# Patient Record
Sex: Male | Born: 1965 | Race: Black or African American | Hispanic: No | Marital: Married | Smoking: Never smoker
Health system: Southern US, Community
[De-identification: ages and names within clinical notes are randomized; demographics above are authoritative.]

## PROBLEM LIST (undated history)

## (undated) DIAGNOSIS — G4733 Obstructive sleep apnea (adult) (pediatric): Secondary | ICD-10-CM

## (undated) DIAGNOSIS — Z9989 Dependence on other enabling machines and devices: Secondary | ICD-10-CM

## (undated) DIAGNOSIS — F419 Anxiety disorder, unspecified: Secondary | ICD-10-CM

## (undated) DIAGNOSIS — I10 Essential (primary) hypertension: Secondary | ICD-10-CM

## (undated) HISTORY — PX: KNEE ARTHROSCOPY: SUR90

## (undated) HISTORY — PX: UMBILICAL HERNIA REPAIR: SHX196

## (undated) HISTORY — PX: HERNIA REPAIR: SHX51

## (undated) HISTORY — PX: JOINT REPLACEMENT: SHX530

---

## 2009-03-24 ENCOUNTER — Ambulatory Visit: Payer: Self-pay | Admitting: Cardiology

## 2015-01-30 ENCOUNTER — Ambulatory Visit: Payer: Self-pay | Admitting: Orthopedic Surgery

## 2015-02-06 ENCOUNTER — Ambulatory Visit: Payer: Self-pay | Admitting: Orthopedic Surgery

## 2015-02-06 ENCOUNTER — Encounter (HOSPITAL_COMMUNITY): Payer: Self-pay

## 2015-02-06 ENCOUNTER — Encounter (HOSPITAL_COMMUNITY)
Admission: RE | Admit: 2015-02-06 | Discharge: 2015-02-06 | Disposition: A | Discharge status: Home / Self Care | Payer: No Typology Code available for payment source | Source: Ambulatory Visit | Attending: Orthopedic Surgery | Admitting: Orthopedic Surgery

## 2015-02-06 DIAGNOSIS — Z01818 Encounter for other preprocedural examination: Secondary | ICD-10-CM | POA: Diagnosis not present

## 2015-02-06 DIAGNOSIS — M1612 Unilateral primary osteoarthritis, left hip: Secondary | ICD-10-CM | POA: Diagnosis not present

## 2015-02-06 DIAGNOSIS — I10 Essential (primary) hypertension: Secondary | ICD-10-CM | POA: Insufficient documentation

## 2015-02-06 DIAGNOSIS — Z0183 Encounter for blood typing: Secondary | ICD-10-CM | POA: Insufficient documentation

## 2015-02-06 DIAGNOSIS — Z01812 Encounter for preprocedural laboratory examination: Secondary | ICD-10-CM | POA: Diagnosis not present

## 2015-02-06 HISTORY — DX: Anxiety disorder, unspecified: F41.9

## 2015-02-06 HISTORY — DX: Essential (primary) hypertension: I10

## 2015-02-06 LAB — URINALYSIS, ROUTINE W REFLEX MICROSCOPIC
Bilirubin Urine: NEGATIVE
GLUCOSE, UA: NEGATIVE mg/dL
Ketones, ur: NEGATIVE mg/dL
Nitrite: NEGATIVE
PH: 6 (ref 5.0–8.0)
PROTEIN: NEGATIVE mg/dL
Specific Gravity, Urine: 1.017 (ref 1.005–1.030)
Urobilinogen, UA: 1 mg/dL (ref 0.0–1.0)

## 2015-02-06 LAB — CBC
HCT: 46.1 % (ref 39.0–52.0)
Hemoglobin: 15.3 g/dL (ref 13.0–17.0)
MCH: 29.1 pg (ref 26.0–34.0)
MCHC: 33.2 g/dL (ref 30.0–36.0)
MCV: 87.6 fL (ref 78.0–100.0)
Platelets: 344 10*3/uL (ref 150–400)
RBC: 5.26 MIL/uL (ref 4.22–5.81)
RDW: 14 % (ref 11.5–15.5)
WBC: 8.2 10*3/uL (ref 4.0–10.5)

## 2015-02-06 LAB — COMPREHENSIVE METABOLIC PANEL
ALBUMIN: 3.5 g/dL (ref 3.5–5.0)
ALT: 11 U/L — ABNORMAL LOW (ref 17–63)
ANION GAP: 7 (ref 5–15)
AST: 18 U/L (ref 15–41)
Alkaline Phosphatase: 61 U/L (ref 38–126)
BUN: 12 mg/dL (ref 6–20)
CO2: 29 mmol/L (ref 22–32)
Calcium: 9.4 mg/dL (ref 8.9–10.3)
Chloride: 99 mmol/L — ABNORMAL LOW (ref 101–111)
Creatinine, Ser: 1.17 mg/dL (ref 0.61–1.24)
GFR calc non Af Amer: >60 mL/min (ref >60)
GLUCOSE: 116 mg/dL — AB (ref 65–99)
POTASSIUM: 3.2 mmol/L — AB (ref 3.5–5.1)
SODIUM: 135 mmol/L (ref 135–145)
TOTAL PROTEIN: 7.2 g/dL (ref 6.5–8.1)
Total Bilirubin: 0.9 mg/dL (ref 0.3–1.2)

## 2015-02-06 LAB — PROTIME-INR
INR: 1.12 (ref 0.00–1.49)
Prothrombin Time: 14.6 seconds (ref 11.6–15.2)

## 2015-02-06 LAB — URINE MICROSCOPIC-ADD ON

## 2015-02-06 LAB — TYPE AND SCREEN
ABO/RH(D): B POS
ANTIBODY SCREEN: NEGATIVE

## 2015-02-06 LAB — ABO/RH: ABO/RH(D): B POS

## 2015-02-06 LAB — SURGICAL PCR SCREEN
MRSA, PCR: NEGATIVE
Staphylococcus aureus: NEGATIVE

## 2015-02-06 LAB — APTT: APTT: 30 s (ref 24–37)

## 2015-02-06 NOTE — H&P (Signed)
TOTAL HIP ADMISSION H&P  Patient is admitted for left total hip arthroplasty.  Subjective:  Chief Complaint: left hip pain  HPI: Jesus Tapia, 49 y.o. male, has a history of pain and functional disability in the left hip(s) due to arthritis and patient has failed non-surgical conservative treatments for greater than 12 weeks to include NSAID's and/or analgesics, flexibility and strengthening excercises, use of assistive devices, weight reduction as appropriate and activity modification.  Onset of symptoms was gradual starting 1 years ago with gradually worsening course since that time.The patient noted no past surgery on the left hip(s).  Patient currently rates pain in the left hip at 10 out of 10 with activity. Patient has night pain, worsening of pain with activity and weight bearing, pain that interfers with activities of daily living and pain with passive range of motion. Patient has evidence of subchondral cysts, subchondral sclerosis, periarticular osteophytes, joint subluxation and joint space narrowing by imaging studies. This condition presents safety issues increasing the risk of falls. There is no current active infection.  There are no active problems to display for this patient.  Past Medical History  Diagnosis Date  . Hypertension   . Sleep apnea     CPAP  USES MAYBE 1 X WEEK   . Anxiety     Past Surgical History  Procedure Laterality Date  . Knee arthroscopy      RT KNEE   . Hernia repair      UMBILICAL      (Not in a hospital admission) No Known Allergies  Social History  Substance Use Topics  . Smoking status: Never Smoker   . Smokeless tobacco: Not on file  . Alcohol Use: No    No family history on file.   Review of Systems  Constitutional: Negative.   HENT: Negative.   Eyes: Negative.   Respiratory: Negative.   Cardiovascular: Negative.   Gastrointestinal: Negative.   Genitourinary: Negative.   Musculoskeletal: Positive for joint pain.  Skin:  Negative.   Neurological: Negative.   Endo/Heme/Allergies: Negative.     Objective:  Physical Exam  Vitals reviewed. Constitutional: He is oriented to person, place, and time. He appears well-developed and well-nourished.  HENT:  Head: Normocephalic and atraumatic.  Eyes: Conjunctivae and EOM are normal. Pupils are equal, round, and reactive to light.  Neck: Normal range of motion. Neck supple.  Cardiovascular: Normal rate and regular rhythm.   Respiratory: Effort normal and breath sounds normal.  GI: Soft. Bowel sounds are normal. He exhibits no distension. There is no tenderness.  Genitourinary:  deferred  Musculoskeletal:       Left hip: He exhibits decreased range of motion and decreased strength.  2+ DP. + TA/GS/EHL. SILT.  Neurological: He is alert and oriented to person, place, and time. He has normal reflexes.  Skin: Skin is warm and dry.  Psychiatric: He has a normal mood and affect. His behavior is normal. Judgment and thought content normal.    Vital signs in last 24 hours: @VSRANGES @  Labs:   Estimated body mass index is 40.29 kg/(m^2) as calculated from the following:   Height as of an earlier encounter on 02/06/15: 6\' 1"  (1.854 m).   Weight as of an earlier encounter on 02/06/15: 138.483 kg (305 lb 4.8 oz).   Imaging Review Plain radiographs demonstrate severe degenerative joint disease of the left hip(s). The bone quality appears to be adequate for age and reported activity level.  Assessment/Plan:  End stage arthritis, left hip(s)  The patient history, physical examination, clinical judgement of the provider and imaging studies are consistent with end stage degenerative joint disease of the left hip(s) and total hip arthroplasty is deemed medically necessary. The treatment options including medical management, injection therapy, arthroscopy and arthroplasty were discussed at length. The risks and benefits of total hip arthroplasty were presented and  reviewed. The risks due to aseptic loosening, infection, stiffness, dislocation/subluxation,  thromboembolic complications and other imponderables were discussed.  The patient acknowledged the explanation, agreed to proceed with the plan and consent was signed. Patient is being admitted for inpatient treatment for surgery, pain control, PT, OT, prophylactic antibiotics, VTE prophylaxis, progressive ambulation and ADL's and discharge planning.The patient is planning to be discharged home with home health services

## 2015-02-06 NOTE — Pre-Procedure Instructions (Signed)
Jesus Tapia  02/06/2015      [facility] - [place], [place] - [place] [address] Phone: [phone] Fax: [phone]    Your procedure is scheduled on  Thursday  02/14/15  Report to [facility] Admitting at 700 A.M.  Call this number if you have problems the morning of surgery:  [phone]   Remember:  Do not eat food or drink liquids after midnight.  Take these medicines the morning of surgery with A SIP OF WATER    AMLODIPINE (NORVASC), PAROXETINE (PAXIL)  (STOP IBUPROFEN/ADVIL/MOTRIN)    Do not wear jewelry, make-up or nail polish.  Do not wear lotions, powders, or perfumes.  You may wear deodorant.  Do not shave 48 hours prior to surgery.  Men may shave face and neck.  Do not bring valuables to the hospital.  [facility] is not responsible for any belongings or valuables.  Contacts, dentures or bridgework may not be worn into surgery.  Leave your suitcase in the car.  After surgery it may be brought to your room.  For patients admitted to the hospital, discharge time will be determined by your treatment team.  Patients discharged the day of surgery will not be allowed to drive home.   Name and phone number of your driver:   Special instructions:  [place] - Preparing for Surgery  Before surgery, you can play an important role.  Because skin is not sterile, your skin needs to be as free of germs as possible.  You can reduce the number of germs on you skin by washing with CHG (chlorahexidine gluconate) soap before surgery.  CHG is an antiseptic cleaner which kills germs and bonds with the skin to continue killing germs even after washing.  Please DO NOT use if you have an allergy to CHG or antibacterial soaps.  If your skin becomes reddened/irritated stop using the CHG and inform your nurse when you arrive at Short Stay.  Do not shave (including legs and underarms) for at least 48 hours prior to the first CHG shower.  You may  shave your face.  Please follow these instructions carefully:   1.  Shower with CHG Soap the night before surgery and the                                morning of Surgery.  2.  If you choose to wash your hair, wash your hair first as usual with your       normal shampoo.  3.  After you shampoo, rinse your hair and body thoroughly to remove the                      Shampoo.  4.  Use CHG as you would any other liquid soap.  You can apply chg directly       to the skin and wash gently with scrungie or a clean washcloth.  5.  Apply the CHG Soap to your body ONLY FROM THE NECK DOWN.        Do not use on open wounds or open sores.  Avoid contact with your eyes,       ears, mouth and genitals (private parts).  Wash genitals (private parts)       with your normal soap.  6.  Wash thoroughly, paying special attention to the area where  your surgery        will be performed.  7.  Thoroughly rinse your body with warm water from the neck down.  8.  DO NOT shower/wash with your normal soap after using and rinsing off       the CHG Soap.  9.  Pat yourself dry with a clean towel.            10.  Wear clean pajamas.            11.  Place clean sheets on your bed the night of your first shower and do not        sleep with pets.  Day of Surgery  Do not apply any lotions/deoderants the morning of surgery.  Please wear clean clothes to the hospital/surgery center.    Please read over the following fact sheets that you were given. Pain Booklet, Coughing and Deep Breathing, Blood Transfusion Information, Total Joint Packet, MRSA Information and Surgical Site Infection Prevention

## 2015-02-13 MED ORDER — CHLORHEXIDINE GLUCONATE 4 % EX LIQD: 60.0000 mL | Freq: Once | CUTANEOUS | Status: DC

## 2015-02-13 MED ORDER — DEXTROSE 5 % IV SOLN
3.0000 g | INTRAVENOUS | Status: AC
  Administered 2015-02-14: 3 g via INTRAVENOUS
  Filled 2015-02-13: qty 3000

## 2015-02-13 MED ORDER — SODIUM CHLORIDE 0.9 % IV SOLN
1000.0000 mg | INTRAVENOUS | Status: DC
  Filled 2015-02-13: qty 10

## 2015-02-13 MED ORDER — TRANEXAMIC ACID 1000 MG/10ML IV SOLN
1000.0000 mg | INTRAVENOUS | Status: AC
  Administered 2015-02-14: 1000 mg via INTRAVENOUS
  Filled 2015-02-13: qty 10

## 2015-02-13 MED ORDER — TRANEXAMIC ACID 1000 MG/10ML IV SOLN
1000.0000 mg | INTRAVENOUS | Status: DC
  Filled 2015-02-13: qty 10

## 2015-02-13 MED ORDER — SODIUM CHLORIDE 0.9 % IV SOLN: INTRAVENOUS | Status: DC

## 2015-02-14 ENCOUNTER — Encounter (HOSPITAL_COMMUNITY): Payer: Self-pay | Admitting: *Deleted

## 2015-02-14 ENCOUNTER — Encounter (HOSPITAL_COMMUNITY)
Admission: RE | Disposition: A | Discharge status: Home / Self Care | Payer: Self-pay | Source: Ambulatory Visit | Attending: Orthopedic Surgery

## 2015-02-14 ENCOUNTER — Inpatient Hospital Stay (HOSPITAL_COMMUNITY)
Admission: RE | Admit: 2015-02-14 | Discharge: 2015-02-15 | DRG: 470 | Disposition: A | Discharge status: Home / Self Care | Payer: PRIVATE HEALTH INSURANCE | Source: Ambulatory Visit | Attending: Orthopedic Surgery | Admitting: Orthopedic Surgery

## 2015-02-14 ENCOUNTER — Inpatient Hospital Stay (HOSPITAL_COMMUNITY): Payer: PRIVATE HEALTH INSURANCE

## 2015-02-14 ENCOUNTER — Inpatient Hospital Stay (HOSPITAL_COMMUNITY): Payer: PRIVATE HEALTH INSURANCE | Admitting: Certified Registered Nurse Anesthetist

## 2015-02-14 DIAGNOSIS — Z09 Encounter for follow-up examination after completed treatment for conditions other than malignant neoplasm: Secondary | ICD-10-CM

## 2015-02-14 DIAGNOSIS — M1612 Unilateral primary osteoarthritis, left hip: Secondary | ICD-10-CM | POA: Diagnosis present

## 2015-02-14 DIAGNOSIS — Z79899 Other long term (current) drug therapy: Secondary | ICD-10-CM

## 2015-02-14 DIAGNOSIS — G4733 Obstructive sleep apnea (adult) (pediatric): Secondary | ICD-10-CM | POA: Diagnosis present

## 2015-02-14 DIAGNOSIS — Z419 Encounter for procedure for purposes other than remedying health state, unspecified: Secondary | ICD-10-CM

## 2015-02-14 DIAGNOSIS — I10 Essential (primary) hypertension: Secondary | ICD-10-CM | POA: Diagnosis present

## 2015-02-14 DIAGNOSIS — Z6841 Body Mass Index (BMI) 40.0 and over, adult: Secondary | ICD-10-CM

## 2015-02-14 DIAGNOSIS — F419 Anxiety disorder, unspecified: Secondary | ICD-10-CM | POA: Diagnosis present

## 2015-02-14 HISTORY — PX: TOTAL HIP ARTHROPLASTY: SHX124

## 2015-02-14 HISTORY — DX: Obstructive sleep apnea (adult) (pediatric): G47.33

## 2015-02-14 HISTORY — DX: Dependence on other enabling machines and devices: Z99.89

## 2015-02-14 SURGERY — ARTHROPLASTY, HIP, TOTAL, ANTERIOR APPROACH
Anesthesia: General | Site: Hip | Laterality: Left

## 2015-02-14 MED ORDER — METHOCARBAMOL 500 MG PO TABS
500.0000 mg | ORAL_TABLET | Freq: Four times a day (QID) | ORAL | Status: DC | PRN
  Administered 2015-02-14 – 2015-02-15 (×2): 500 mg via ORAL
  Filled 2015-02-14 (×2): qty 1

## 2015-02-14 MED ORDER — KETOROLAC TROMETHAMINE 30 MG/ML IJ SOLN
INTRAMUSCULAR | Status: DC | PRN
  Administered 2015-02-14: 30 mg via INTRA_ARTICULAR

## 2015-02-14 MED ORDER — PROMETHAZINE HCL 25 MG/ML IJ SOLN
6.2500 mg | INTRAMUSCULAR | Status: DC | PRN
Start: 2015-02-14 — End: 2015-02-14

## 2015-02-14 MED ORDER — ROCURONIUM BROMIDE 100 MG/10ML IV SOLN
INTRAVENOUS | Status: DC | PRN
  Administered 2015-02-14 (×2): 20 mg via INTRAVENOUS
  Administered 2015-02-14: 10 mg via INTRAVENOUS
  Administered 2015-02-14: 50 mg via INTRAVENOUS

## 2015-02-14 MED ORDER — BUPIVACAINE-EPINEPHRINE (PF) 0.5% -1:200000 IJ SOLN
INTRAMUSCULAR | Status: AC
  Filled 2015-02-14: qty 30

## 2015-02-14 MED ORDER — GLYCOPYRROLATE 0.2 MG/ML IJ SOLN
INTRAMUSCULAR | Status: DC | PRN
  Administered 2015-02-14: .8 mg via INTRAVENOUS

## 2015-02-14 MED ORDER — ONDANSETRON HCL 4 MG PO TABS: 4.0000 mg | ORAL_TABLET | Freq: Four times a day (QID) | ORAL | Status: DC | PRN

## 2015-02-14 MED ORDER — MIDAZOLAM HCL 5 MG/5ML IJ SOLN
INTRAMUSCULAR | Status: DC | PRN
  Administered 2015-02-14: 2 mg via INTRAVENOUS

## 2015-02-14 MED ORDER — GLYCOPYRROLATE 0.2 MG/ML IJ SOLN
INTRAMUSCULAR | Status: AC
  Filled 2015-02-14: qty 4

## 2015-02-14 MED ORDER — DEXAMETHASONE SODIUM PHOSPHATE 10 MG/ML IJ SOLN
10.0000 mg | Freq: Once | INTRAMUSCULAR | Status: AC
  Administered 2015-02-15: 10 mg via INTRAVENOUS
  Filled 2015-02-14: qty 1

## 2015-02-14 MED ORDER — DOCUSATE SODIUM 100 MG PO CAPS
100.0000 mg | ORAL_CAPSULE | Freq: Two times a day (BID) | ORAL | Status: DC
  Administered 2015-02-14 – 2015-02-15 (×3): 100 mg via ORAL
  Filled 2015-02-14 (×3): qty 1

## 2015-02-14 MED ORDER — SUCCINYLCHOLINE CHLORIDE 20 MG/ML IJ SOLN
INTRAMUSCULAR | Status: AC
Start: 2015-02-14 — End: 2015-02-14
  Filled 2015-02-14: qty 1

## 2015-02-14 MED ORDER — METOCLOPRAMIDE HCL 5 MG PO TABS: 5.0000 mg | ORAL_TABLET | Freq: Three times a day (TID) | ORAL | Status: DC | PRN

## 2015-02-14 MED ORDER — BUPIVACAINE-EPINEPHRINE (PF) 0.5% -1:200000 IJ SOLN
INTRAMUSCULAR | Status: DC | PRN
  Administered 2015-02-14: 30 mL via PERINEURAL

## 2015-02-14 MED ORDER — 0.9 % SODIUM CHLORIDE (POUR BTL) OPTIME
TOPICAL | Status: DC | PRN
  Administered 2015-02-14: 1000 mL

## 2015-02-14 MED ORDER — NEOSTIGMINE METHYLSULFATE 10 MG/10ML IV SOLN
INTRAVENOUS | Status: DC | PRN
  Administered 2015-02-14: 5 mg via INTRAVENOUS

## 2015-02-14 MED ORDER — ACETAMINOPHEN 650 MG RE SUPP: 650.0000 mg | Freq: Four times a day (QID) | RECTAL | Status: DC | PRN

## 2015-02-14 MED ORDER — MEPERIDINE HCL 25 MG/ML IJ SOLN: 6.2500 mg | INTRAMUSCULAR | Status: DC | PRN

## 2015-02-14 MED ORDER — HYDROMORPHONE HCL 1 MG/ML IJ SOLN: 0.5000 mg | INTRAMUSCULAR | Status: DC | PRN

## 2015-02-14 MED ORDER — FENTANYL CITRATE (PF) 250 MCG/5ML IJ SOLN
INTRAMUSCULAR | Status: AC
  Filled 2015-02-14: qty 5

## 2015-02-14 MED ORDER — MIDAZOLAM HCL 2 MG/2ML IJ SOLN
INTRAMUSCULAR | Status: AC
  Filled 2015-02-14: qty 4

## 2015-02-14 MED ORDER — METHOCARBAMOL 1000 MG/10ML IJ SOLN
500.0000 mg | Freq: Four times a day (QID) | INTRAVENOUS | Status: DC | PRN
  Filled 2015-02-14: qty 5

## 2015-02-14 MED ORDER — LIDOCAINE HCL (CARDIAC) 20 MG/ML IV SOLN
INTRAVENOUS | Status: DC | PRN
  Administered 2015-02-14: 30 mg via INTRAVENOUS

## 2015-02-14 MED ORDER — ALBUMIN HUMAN 5 % IV SOLN
12.5000 g | Freq: Once | INTRAVENOUS | Status: AC
  Administered 2015-02-14: 12.5 g via INTRAVENOUS
  Filled 2015-02-14: qty 250

## 2015-02-14 MED ORDER — SODIUM CHLORIDE 0.9 % IR SOLN
Status: DC | PRN
  Administered 2015-02-14: 1000 mL
  Administered 2015-02-14: 3000 mL

## 2015-02-14 MED ORDER — MENTHOL 3 MG MT LOZG: 1.0000 | LOZENGE | OROMUCOSAL | Status: DC | PRN

## 2015-02-14 MED ORDER — LIDOCAINE HCL (CARDIAC) 20 MG/ML IV SOLN
INTRAVENOUS | Status: AC
Start: 2015-02-14 — End: 2015-02-14
  Filled 2015-02-14: qty 5

## 2015-02-14 MED ORDER — PAROXETINE HCL 20 MG PO TABS
20.0000 mg | ORAL_TABLET | Freq: Every day | ORAL | Status: DC
  Administered 2015-02-14: 20 mg via ORAL
  Filled 2015-02-14 (×2): qty 1

## 2015-02-14 MED ORDER — ALBUTEROL SULFATE HFA 108 (90 BASE) MCG/ACT IN AERS
INHALATION_SPRAY | RESPIRATORY_TRACT | Status: DC | PRN
  Administered 2015-02-14: 6 via RESPIRATORY_TRACT

## 2015-02-14 MED ORDER — ACETAMINOPHEN 325 MG PO TABS: 650.0000 mg | ORAL_TABLET | Freq: Four times a day (QID) | ORAL | Status: DC | PRN

## 2015-02-14 MED ORDER — ALBUMIN HUMAN 5 % IV SOLN
INTRAVENOUS | Status: AC
  Filled 2015-02-14: qty 250

## 2015-02-14 MED ORDER — METOCLOPRAMIDE HCL 5 MG/ML IJ SOLN: 5.0000 mg | Freq: Three times a day (TID) | INTRAMUSCULAR | Status: DC | PRN

## 2015-02-14 MED ORDER — HYDROCODONE-ACETAMINOPHEN 5-325 MG PO TABS
1.0000 | ORAL_TABLET | ORAL | Status: DC | PRN
  Administered 2015-02-14: 2 via ORAL
  Administered 2015-02-14: 1 via ORAL
  Administered 2015-02-15: 2 via ORAL
  Filled 2015-02-14 (×2): qty 2
  Filled 2015-02-14: qty 1

## 2015-02-14 MED ORDER — HYDROMORPHONE HCL 1 MG/ML IJ SOLN: 0.2500 mg | INTRAMUSCULAR | Status: DC | PRN

## 2015-02-14 MED ORDER — AMLODIPINE BESYLATE 10 MG PO TABS
10.0000 mg | ORAL_TABLET | Freq: Every day | ORAL | Status: DC
  Administered 2015-02-15: 10 mg via ORAL
  Filled 2015-02-14: qty 1

## 2015-02-14 MED ORDER — SENNA 8.6 MG PO TABS
2.0000 | ORAL_TABLET | Freq: Every day | ORAL | Status: DC
  Administered 2015-02-14: 17.2 mg via ORAL
  Filled 2015-02-14: qty 2

## 2015-02-14 MED ORDER — PROPOFOL 10 MG/ML IV BOLUS
INTRAVENOUS | Status: DC | PRN
  Administered 2015-02-14: 200 mg via INTRAVENOUS

## 2015-02-14 MED ORDER — ONDANSETRON HCL 4 MG/2ML IJ SOLN
INTRAMUSCULAR | Status: DC | PRN
  Administered 2015-02-14: 4 mg via INTRAVENOUS

## 2015-02-14 MED ORDER — ROCURONIUM BROMIDE 50 MG/5ML IV SOLN
INTRAVENOUS | Status: AC
  Filled 2015-02-14: qty 1

## 2015-02-14 MED ORDER — DEXAMETHASONE SODIUM PHOSPHATE 4 MG/ML IJ SOLN
INTRAMUSCULAR | Status: DC | PRN
  Administered 2015-02-14: 4 mg via INTRAVENOUS

## 2015-02-14 MED ORDER — FENTANYL CITRATE (PF) 100 MCG/2ML IJ SOLN
INTRAMUSCULAR | Status: DC | PRN
  Administered 2015-02-14 (×2): 100 ug via INTRAVENOUS
  Administered 2015-02-14: 250 ug via INTRAVENOUS
  Administered 2015-02-14: 50 ug via INTRAVENOUS

## 2015-02-14 MED ORDER — ASPIRIN EC 325 MG PO TBEC
325.0000 mg | DELAYED_RELEASE_TABLET | Freq: Every day | ORAL | Status: DC
  Administered 2015-02-15: 325 mg via ORAL
  Filled 2015-02-14: qty 1

## 2015-02-14 MED ORDER — MIDAZOLAM HCL 2 MG/2ML IJ SOLN: 0.5000 mg | Freq: Once | INTRAMUSCULAR | Status: DC | PRN

## 2015-02-14 MED ORDER — PHENOL 1.4 % MT LIQD: 1.0000 | OROMUCOSAL | Status: DC | PRN

## 2015-02-14 MED ORDER — ONDANSETRON HCL 4 MG/2ML IJ SOLN: 4.0000 mg | Freq: Four times a day (QID) | INTRAMUSCULAR | Status: DC | PRN

## 2015-02-14 MED ORDER — ACETAMINOPHEN 10 MG/ML IV SOLN
1000.0000 mg | INTRAVENOUS | Status: AC
  Administered 2015-02-14: 1000 mg via INTRAVENOUS
  Filled 2015-02-14: qty 100

## 2015-02-14 MED ORDER — ONDANSETRON HCL 4 MG/2ML IJ SOLN
INTRAMUSCULAR | Status: AC
  Filled 2015-02-14: qty 2

## 2015-02-14 MED ORDER — EPHEDRINE SULFATE 50 MG/ML IJ SOLN
INTRAMUSCULAR | Status: DC | PRN
  Administered 2015-02-14: 15 mg via INTRAVENOUS
  Administered 2015-02-14: 5 mg via INTRAVENOUS
  Administered 2015-02-14: 10 mg via INTRAVENOUS

## 2015-02-14 MED ORDER — PROPOFOL 10 MG/ML IV BOLUS
INTRAVENOUS | Status: AC
Start: 2015-02-14 — End: 2015-02-14
  Filled 2015-02-14: qty 20

## 2015-02-14 MED ORDER — SODIUM CHLORIDE 0.9 % IV SOLN
INTRAVENOUS | Status: DC
  Administered 2015-02-14 – 2015-02-15 (×2): via INTRAVENOUS

## 2015-02-14 MED ORDER — PROPOFOL 10 MG/ML IV BOLUS
INTRAVENOUS | Status: AC
  Filled 2015-02-14: qty 20

## 2015-02-14 MED ORDER — DEXAMETHASONE SODIUM PHOSPHATE 4 MG/ML IJ SOLN
INTRAMUSCULAR | Status: AC
  Filled 2015-02-14: qty 1

## 2015-02-14 MED ORDER — LACTATED RINGERS IV SOLN
INTRAVENOUS | Status: DC
  Administered 2015-02-14 (×2): via INTRAVENOUS

## 2015-02-14 MED ORDER — CEFAZOLIN SODIUM-DEXTROSE 2-3 GM-% IV SOLR
2.0000 g | Freq: Four times a day (QID) | INTRAVENOUS | Status: AC
  Administered 2015-02-14 (×2): 2 g via INTRAVENOUS
  Filled 2015-02-14 (×3): qty 50

## 2015-02-14 SURGICAL SUPPLY — 53 items
BLADE SAW SGTL 18X1.27X75 (BLADE) IMPLANT
BLADE SAW SGTL 18X1.27X75MM (BLADE)
BLADE SURG ROTATE 9660 (MISCELLANEOUS) ×3 IMPLANT
CAPT HIP TOTAL 2 ×3 IMPLANT
CHLORAPREP W/TINT 26ML (MISCELLANEOUS) ×3 IMPLANT
COVER SURGICAL LIGHT HANDLE (MISCELLANEOUS) ×3 IMPLANT
DERMABOND ADVANCED (GAUZE/BANDAGES/DRESSINGS) ×2
DERMABOND ADVANCED .7 DNX12 (GAUZE/BANDAGES/DRESSINGS) ×1 IMPLANT
DRAPE C-ARM 42X72 X-RAY (DRAPES) ×3 IMPLANT
DRAPE IMP U-DRAPE 54X76 (DRAPES) ×6 IMPLANT
DRAPE STERI IOBAN 125X83 (DRAPES) ×3 IMPLANT
DRAPE U-SHAPE 47X51 STRL (DRAPES) ×9 IMPLANT
DRSG AQUACEL AG ADV 3.5X10 (GAUZE/BANDAGES/DRESSINGS) ×3 IMPLANT
ELECT BLADE 4.0 EZ CLEAN MEGAD (MISCELLANEOUS) ×3
ELECT REM PT RETURN 9FT ADLT (ELECTROSURGICAL) ×3
ELECTRODE BLDE 4.0 EZ CLN MEGD (MISCELLANEOUS) ×1 IMPLANT
ELECTRODE REM PT RTRN 9FT ADLT (ELECTROSURGICAL) ×1 IMPLANT
EVACUATOR 1/8 PVC DRAIN (DRAIN) IMPLANT
GLOVE BIO SURGEON STRL SZ8.5 (GLOVE) ×12 IMPLANT
GLOVE BIOGEL PI IND STRL 8.5 (GLOVE) ×1 IMPLANT
GLOVE BIOGEL PI INDICATOR 8.5 (GLOVE) ×2
GOWN STRL REUS W/ TWL LRG LVL3 (GOWN DISPOSABLE) ×3 IMPLANT
GOWN STRL REUS W/TWL 2XL LVL3 (GOWN DISPOSABLE) ×3 IMPLANT
GOWN STRL REUS W/TWL LRG LVL3 (GOWN DISPOSABLE) ×6
HANDPIECE INTERPULSE COAX TIP (DISPOSABLE) ×2
HOOD PEEL AWAY FACE SHEILD DIS (HOOD) ×3 IMPLANT
KIT BASIN OR (CUSTOM PROCEDURE TRAY) ×3 IMPLANT
KIT ROOM TURNOVER OR (KITS) ×3 IMPLANT
MANIFOLD NEPTUNE II (INSTRUMENTS) ×3 IMPLANT
MARKER SKIN DUAL TIP RULER LAB (MISCELLANEOUS) ×3 IMPLANT
NEEDLE 18GX1X1/2 (RX/OR ONLY) (NEEDLE) ×3 IMPLANT
NEEDLE SPNL 18GX3.5 QUINCKE PK (NEEDLE) ×3 IMPLANT
NS IRRIG 1000ML POUR BTL (IV SOLUTION) ×3 IMPLANT
PACK TOTAL JOINT (CUSTOM PROCEDURE TRAY) ×3 IMPLANT
PACK UNIVERSAL I (CUSTOM PROCEDURE TRAY) ×3 IMPLANT
PAD ARMBOARD 7.5X6 YLW CONV (MISCELLANEOUS) ×6 IMPLANT
SAW OSC TIP CART 19.5X105X1.3 (SAW) ×3 IMPLANT
SEALER BIPOLAR AQUA 6.0 (INSTRUMENTS) ×3 IMPLANT
SET HNDPC FAN SPRY TIP SCT (DISPOSABLE) ×1 IMPLANT
SUCTION FRAZIER TIP 10 FR DISP (SUCTIONS) ×3 IMPLANT
SUT ETHIBOND NAB CT1 #1 30IN (SUTURE) ×6 IMPLANT
SUT MNCRL AB 3-0 PS2 18 (SUTURE) ×3 IMPLANT
SUT MON AB 2-0 CT1 36 (SUTURE) ×3 IMPLANT
SUT VIC AB 1 CT1 27 (SUTURE) ×2
SUT VIC AB 1 CT1 27XBRD ANBCTR (SUTURE) ×1 IMPLANT
SUT VIC AB 2-0 CT1 27 (SUTURE) ×2
SUT VIC AB 2-0 CT1 TAPERPNT 27 (SUTURE) ×1 IMPLANT
SUT VLOC 180 0 24IN GS25 (SUTURE) ×3 IMPLANT
SYR 30ML SLIP (SYRINGE) ×3 IMPLANT
SYR 50ML LL SCALE MARK (SYRINGE) ×3 IMPLANT
SYR 5ML LL (SYRINGE) ×3 IMPLANT
TOWEL OR 17X24 6PK STRL BLUE (TOWEL DISPOSABLE) ×3 IMPLANT
TOWEL OR 17X26 10 PK STRL BLUE (TOWEL DISPOSABLE) ×3 IMPLANT

## 2015-02-14 NOTE — Interval H&P Note (Signed)
History and Physical Interval Note:  02/14/2015 8:51 AM  Jesus Tapia  has presented today for surgery, with the diagnosis of LEFT HIP OA  The various methods of treatment have been discussed with the patient and family. After consideration of risks, benefits and other options for treatment, the patient has consented to  Procedure(s): TOTAL HIP ARTHROPLASTY ANTERIOR APPROACH (Left) as a surgical intervention .  The patient's history has been reviewed, patient examined, no change in status, stable for surgery.  I have reviewed the patient's chart and labs.  Questions were answered to the patient's satisfaction.     Ashaki Frosch, Horald Pollen

## 2015-02-14 NOTE — Discharge Summary (Signed)
Physician Discharge Summary  Patient ID: Jesus Tapia MRN: [id number] DOB/AGE: [birth year] 49 y.o.  Admit date: 02/14/2015 Discharge date: 02/15/2015  Admission Diagnoses:  Osteoarthritis of left hip  Discharge Diagnoses:  Principal Problem:   Osteoarthritis of left hip   Past Medical History  Diagnosis Date  . Hypertension   . Anxiety   . OSA on CPAP     "might use CPAP once/month" (02/14/2015)    Surgeries: Procedure(s): TOTAL HIP ARTHROPLASTY ANTERIOR APPROACH on 02/14/2015   Consultants (if any):    Discharged Condition: Improved  Hospital Course: Jesus Tapia is an 49 y.o. male who was admitted 02/14/2015 with a diagnosis of Osteoarthritis of left hip and went to the operating room on 02/14/2015 and underwent the above named procedures.    He was given perioperative antibiotics:      Anti-infectives    Start     Dose/Rate Route Frequency Ordered Stop   02/14/15 1530  ceFAZolin (ANCEF) IVPB 2 g/50 mL premix     2 g 100 mL/hr over 30 Minutes Intravenous Every 6 hours 02/14/15 1412 02/14/15 2147   02/14/15 0900  ceFAZolin (ANCEF) 3 g in dextrose 5 % 50 mL IVPB     3 g 160 mL/hr over 30 Minutes Intravenous To [facility] 02/13/15 1316 02/14/15 0944    .  He was given sequential compression devices, early ambulation, and ASA for DVT prophylaxis.  He benefited maximally from the hospital stay and there were no complications.    Recent vital signs:  Filed Vitals:   02/15/15 0412  BP: 109/67  Pulse: 79  Temp: 98.7 F (37.1 C)  Resp: 16    Recent laboratory studies:  Lab Results  Component Value Date   HGB 11.3* 02/15/2015   HGB 15.3 02/06/2015   Lab Results  Component Value Date   WBC 12.3* 02/15/2015   PLT 300 02/15/2015   Lab Results  Component Value Date   INR 1.12 02/06/2015   Lab Results  Component Value Date   NA 138 02/15/2015   K 3.5 02/15/2015   CL 105 02/15/2015   CO2 25 02/15/2015   BUN 12 02/15/2015   CREATININE  1.13 02/15/2015   GLUCOSE 116* 02/15/2015    Discharge Medications:     Medication List    STOP taking these medications        ibuprofen 200 MG tablet  Commonly known as:  ADVIL,MOTRIN      TAKE these medications        amLODipine 10 MG tablet  Commonly known as:  NORVASC  Take 10 mg by mouth daily.     aspirin EC 325 MG tablet  Take 1 tablet (325 mg total) by mouth 2 (two) times daily after a meal.     docusate sodium 100 MG capsule  Commonly known as:  COLACE  Take 1 capsule (100 mg total) by mouth 2 (two) times daily.     HYDROcodone-acetaminophen 5-325 MG tablet  Commonly known as:  NORCO  Take 1-2 tablets by mouth every 4 (four) hours as needed for moderate pain.     meloxicam 15 MG tablet  Commonly known as:  MOBIC  Take 1 tablet (15 mg total) by mouth daily.     methocarbamol 500 MG tablet  Commonly known as:  ROBAXIN  Take 1 tablet (500 mg total) by mouth every 6 (six) hours as needed for muscle spasms.     ondansetron 4 MG tablet  Commonly known as:  ZOFRAN  Take 1 tablet (4 mg total) by mouth every 6 (six) hours as needed for nausea.     PARoxetine 20 MG tablet  Commonly known as:  PAXIL  Take 20 mg by mouth daily.     senna 8.6 MG Tabs tablet  Commonly known as:  SENOKOT  Take 2 tablets (17.2 mg total) by mouth at bedtime.        Diagnostic Studies: Dg Pelvis Portable  02/14/2015  CLINICAL DATA:  49 year old male post left hip replacement. Subsequent encounter. EXAM: PORTABLE PELVIS 1-2 VIEWS COMPARISON:  Intraoperative exam. FINDINGS: AP supine view of the pelvis reveals total left hip replacement in satisfactory position on this single projection. Bony overgrowth superior margin of the acetabulum may reflect changes from preop of degenerative disease and can be assessed on followup. IMPRESSION: Post total left hip replacement appearing in satisfactory position on this single projection. Bony overgrowth superior margin of the acetabulum may reflect  changes from preop of degenerative disease and can be assessed on followup. Electronically Signed   By: Genia Del M.D.   On: 02/14/2015 13:41   Dg Hip Operative Unilat With Pelvis Left  02/14/2015  CLINICAL DATA:  Left hip surgery. EXAM: OPERATIVE left HIP (WITH PELVIS IF PERFORMED) 2 VIEWS TECHNIQUE: Fluoroscopic spot image(s) were submitted for interpretation post-operatively. COMPARISON:  None. FINDINGS: Total left hip replacement with good anatomic alignment. Hardware intact. Two images. 0 minutes 36 seconds fluoroscopy time . IMPRESSION: Total left hip replacement. Electronically Signed   By: [place]   On: 02/14/2015 11:47    Disposition: Final discharge disposition not confirmed  Discharge Instructions    Call MD / Call 911    Complete by:  As directed   If you experience chest pain or shortness of breath, CALL 911 and be transported to the hospital emergency room.  If you develope a fever above 101 F, pus (white drainage) or increased drainage or redness at the wound, or calf pain, call your surgeon's office.     Constipation Prevention    Complete by:  As directed   Drink plenty of fluids.  Prune juice may be helpful.  You may use a stool softener, such as Colace (over the counter) 100 mg twice a day.  Use MiraLax (over the counter) for constipation as needed.     Diet - low sodium heart healthy    Complete by:  As directed      Driving restrictions    Complete by:  As directed   No driving for 6 weeks     Increase activity slowly as tolerated    Complete by:  As directed      Lifting restrictions    Complete by:  As directed   No lifting for 6 weeks     TED hose    Complete by:  As directed   Use stockings (TED hose) for 2 weeks on both leg(s).  You may remove them at night for sleeping.           Follow-up Information    Follow up with Kolbee Stallman, Horald Pollen, MD. Schedule an appointment as soon as possible for a visit in 2 weeks.   Specialty:  Orthopedic  Surgery   Why:  For wound re-check   Contact information:   [place] [address] [phone]        Signed: Elie Goody 02/15/2015, 11:09 AM

## 2015-02-14 NOTE — Anesthesia Postprocedure Evaluation (Signed)
  Anesthesia Post-op Note  Patient: Jesus Tapia  Procedure(s) Performed: Procedure(s): TOTAL HIP ARTHROPLASTY ANTERIOR APPROACH (Left)  Patient Location: PACU  Anesthesia Type:General  Level of Consciousness: awake, alert , oriented and patient cooperative  Airway and Oxygen Therapy: Patient Spontanous Breathing and Patient connected to nasal cannula oxygen  Post-op Pain: none  Post-op Assessment: Post-op Vital signs reviewed, Patient's Cardiovascular Status Stable, Respiratory Function Stable, Patent Airway, No signs of Nausea or vomiting and Pain level controlled              Post-op Vital Signs: Reviewed and stable  Last Vitals:  Filed Vitals:   02/14/15 1403  BP: 119/63  Pulse:   Temp: 36.4 C  Resp: 16    Complications: No apparent anesthesia complications

## 2015-02-14 NOTE — H&P (View-Only) (Signed)
TOTAL HIP ADMISSION H&P  Patient is admitted for left total hip arthroplasty.  Subjective:  Chief Complaint: left hip pain  HPI: Jesus Tapia, 49 y.o. male, has a history of pain and functional disability in the left hip(s) due to arthritis and patient has failed non-surgical conservative treatments for greater than 12 weeks to include NSAID's and/or analgesics, flexibility and strengthening excercises, use of assistive devices, weight reduction as appropriate and activity modification.  Onset of symptoms was gradual starting 1 years ago with gradually worsening course since that time.The patient noted no past surgery on the left hip(s).  Patient currently rates pain in the left hip at 10 out of 10 with activity. Patient has night pain, worsening of pain with activity and weight bearing, pain that interfers with activities of daily living and pain with passive range of motion. Patient has evidence of subchondral cysts, subchondral sclerosis, periarticular osteophytes, joint subluxation and joint space narrowing by imaging studies. This condition presents safety issues increasing the risk of falls. There is no current active infection.  There are no active problems to display for this patient.  Past Medical History  Diagnosis Date  . Hypertension   . Sleep apnea     CPAP  USES MAYBE 1 X WEEK   . Anxiety     Past Surgical History  Procedure Laterality Date  . Knee arthroscopy      RT KNEE   . Hernia repair      UMBILICAL      (Not in a hospital admission) No Known Allergies  Social History  Substance Use Topics  . Smoking status: Never Smoker   . Smokeless tobacco: Not on file  . Alcohol Use: No    No family history on file.   Review of Systems  Constitutional: Negative.   HENT: Negative.   Eyes: Negative.   Respiratory: Negative.   Cardiovascular: Negative.   Gastrointestinal: Negative.   Genitourinary: Negative.   Musculoskeletal: Positive for joint pain.  Skin:  Negative.   Neurological: Negative.   Endo/Heme/Allergies: Negative.     Objective:  Physical Exam  Vitals reviewed. Constitutional: He is oriented to person, place, and time. He appears well-developed and well-nourished.  HENT:  Head: Normocephalic and atraumatic.  Eyes: Conjunctivae and EOM are normal. Pupils are equal, round, and reactive to light.  Neck: Normal range of motion. Neck supple.  Cardiovascular: Normal rate and regular rhythm.   Respiratory: Effort normal and breath sounds normal.  GI: Soft. Bowel sounds are normal. He exhibits no distension. There is no tenderness.  Genitourinary:  deferred  Musculoskeletal:       Left hip: He exhibits decreased range of motion and decreased strength.  2+ DP. + TA/GS/EHL. SILT.  Neurological: He is alert and oriented to person, place, and time. He has normal reflexes.  Skin: Skin is warm and dry.  Psychiatric: He has a normal mood and affect. His behavior is normal. Judgment and thought content normal.    Vital signs in last 24 hours: @VSRANGES@  Labs:   Estimated body mass index is 40.29 kg/(m^2) as calculated from the following:   Height as of an earlier encounter on 02/06/15: 6' 1" (1.854 m).   Weight as of an earlier encounter on 02/06/15: 138.483 kg (305 lb 4.8 oz).   Imaging Review Plain radiographs demonstrate severe degenerative joint disease of the left hip(s). The bone quality appears to be adequate for age and reported activity level.  Assessment/Plan:  End stage arthritis, left hip(s)    The patient history, physical examination, clinical judgement of the provider and imaging studies are consistent with end stage degenerative joint disease of the left hip(s) and total hip arthroplasty is deemed medically necessary. The treatment options including medical management, injection therapy, arthroscopy and arthroplasty were discussed at length. The risks and benefits of total hip arthroplasty were presented and  reviewed. The risks due to aseptic loosening, infection, stiffness, dislocation/subluxation,  thromboembolic complications and other imponderables were discussed.  The patient acknowledged the explanation, agreed to proceed with the plan and consent was signed. Patient is being admitted for inpatient treatment for surgery, pain control, PT, OT, prophylactic antibiotics, VTE prophylaxis, progressive ambulation and ADL's and discharge planning.The patient is planning to be discharged home with home health services 

## 2015-02-14 NOTE — Anesthesia Preprocedure Evaluation (Addendum)
Anesthesia Evaluation  Patient identified by MRN, date of birth, ID band Patient awake    Reviewed: Allergy & Precautions, NPO status , Patient's Chart, lab work & pertinent test results  History of Anesthesia Complications Negative for: history of anesthetic complications  Airway Mallampati: II  TM Distance: >3 FB Neck ROM: Full    Dental  (+) Dental Advisory Given, Missing, Chipped   Pulmonary sleep apnea and Continuous Positive Airway Pressure Ventilation ,    breath sounds clear to auscultation       Cardiovascular hypertension, Pt. on medications (-) angina Rhythm:Regular Rate:Normal     Neuro/Psych PSYCHIATRIC DISORDERS Anxiety negative neurological ROS     GI/Hepatic negative GI ROS, Neg liver ROS,   Endo/Other  Morbid obesity  Renal/GU negative Renal ROS     Musculoskeletal  (+) Arthritis , Osteoarthritis,    Abdominal (+) + obese,   Peds  Hematology negative hematology ROS (+)   Anesthesia Other Findings   Reproductive/Obstetrics                          Anesthesia Physical Anesthesia Plan  ASA: III  Anesthesia Plan: General   Post-op Pain Management:    Induction: Intravenous  Airway Management Planned:   Additional Equipment:   Intra-op Plan:   Post-operative Plan: Extubation in OR  Informed Consent: I have reviewed the patients History and Physical, chart, labs and discussed the procedure including the risks, benefits and alternatives for the proposed anesthesia with the patient or authorized representative who has indicated his/her understanding and acceptance.   Dental advisory given  Plan Discussed with: Surgeon and CRNA  Anesthesia Plan Comments: (Plan routine monitors, GETA)        Anesthesia Quick Evaluation

## 2015-02-14 NOTE — Transfer of Care (Signed)
Immediate Anesthesia Transfer of Care Note  Patient: Jesus Tapia  Procedure(s) Performed: Procedure(s): TOTAL HIP ARTHROPLASTY ANTERIOR APPROACH (Left)  Patient Location: PACU  Anesthesia Type:General  Level of Consciousness: responds to stimulation, pt drowsy  Airway & Oxygen Therapy: Patient Spontanous Breathing and Patient connected to face mask oxygen  Post-op Assessment: Report given to RN and Post -op Vital signs reviewed and stable  Post vital signs: Reviewed and stable  Last Vitals:  Filed Vitals:   02/14/15 0717  BP: 150/92  Pulse: 71  Temp: 36.8 C  Resp: 20    Complications: No apparent anesthesia complications

## 2015-02-14 NOTE — Progress Notes (Signed)
Utilization review completed. Nihal Doan, RN, BSN. 

## 2015-02-14 NOTE — Progress Notes (Signed)
Physical Therapy Evaluation Patient Details Name: Jesus Tapia MRN: [id number] DOB: May 08, 1965 Today's Date: 02/14/2015   History of Present Illness  49 y.o. male s/p left total hip arthroplasty. Hx of HTN and Anxiety.  Clinical Impression  Pt is s/p left THA resulting in the deficits listed below (see PT Problem List). Demonstrates strong ability to bear majority of weight through LLE, requiring very minimal assist for bed mobility and transfer to stand post op day #0. We were unfortunately limited by symptomatic low blood pressure upon standing. Improved once back in bed, RN notified. Very motivated and I anticipate he will progress quickly towards his functional goals. Pt will benefit from skilled PT to increase their independence and safety with mobility to allow discharge to the venue listed below.      Follow Up Recommendations Home health PT;Supervision for mobility/OOB    Equipment Recommendations  Rolling walker with 5" wheels    Recommendations for Other Services       Precautions / Restrictions Precautions Precautions: Fall Precaution Comments: direct anterior approach, no precautions Restrictions Weight Bearing Restrictions: Yes LLE Weight Bearing: Weight bearing as tolerated      Mobility  Bed Mobility Overal bed mobility: Needs Assistance Bed Mobility: Supine to Sit;Sit to Supine     Supine to sit: Min assist Sit to supine: Min assist   General bed mobility comments: Very minimal assist for LLE support in and out of bed. Educated on technique and for use of RLE to support LLE as needed.  Transfers Overall transfer level: Needs assistance Equipment used: Rolling walker (2 wheeled) Transfers: Sit to/from Stand Sit to Stand: Min assist         General transfer comment: Min assist for balance to rise from lowest bed setting. Heavy use of rail and limited weight-bearing through LLE initially. Good stability once upright.   Ambulation/Gait              General Gait Details: deferred due to drop in BP  Stairs            Wheelchair Mobility    Modified Rankin (Stroke Patients Only)       Balance Overall balance assessment: Needs assistance Sitting-balance support: No upper extremity supported;Feet supported Sitting balance-Leahy Scale: Good     Standing balance support: No upper extremity supported Standing balance-Leahy Scale: Fair Standing balance comment: Able to tolerate weight-shifting activity to left and right as well as small marches for weight acceptance on LLE without buckling. Limited due to increasing lightheadedness.                             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 Not formally tested, functionally able to  stand and bear majority of weight through LLE  Cervical / Trunk Assessment: Normal  Communication   Communication: No difficulties  Cognition Arousal/Alertness: Awake/alert Behavior During Therapy: WFL for tasks assessed/performed Overall Cognitive Status: Within Functional Limits for tasks assessed                      General Comments General comments (skin integrity, edema, etc.): BP 73/43 symptomatic, HR 66, SpO2 97.  Symptoms improve with sitting. RN notified    Exercises General Exercises - Lower Extremity Ankle Circles/Pumps: AROM;Both;10 reps;Supine Quad Sets: Strengthening;Left;5 reps;Supine Heel Slides: Strengthening;Left;5 reps;Supine      Assessment/Plan    PT Assessment Patient needs continued PT services  PT Diagnosis Difficulty walking;Generalized weakness;Acute pain   PT Problem List Decreased strength;Decreased range of motion;Decreased activity tolerance;Decreased balance;Decreased mobility;Decreased knowledge of use of DME;Pain;Obesity  PT Treatment Interventions DME instruction;Gait training;Stair training;Functional mobility training;Therapeutic activities;Therapeutic exercise;Balance training;Neuromuscular re-education;Patient/family education   PT Goals (Current goals can be found in the Care Plan section) Acute Rehab PT Goals Patient Stated Goal: Go home soon PT Goal Formulation: With patient Time For Goal Achievement: 02/21/15 Potential to Achieve Goals: Good    Frequency 7X/week   Barriers to discharge        Co-evaluation               End of Session   Activity Tolerance: Treatment limited secondary to medical complications (Comment) (symptomatic low BP upon standing) Patient left: in chair Nurse Communication: Mobility status;Other (comment) (low BP)         Time: [id number] PT Time Calculation (min) (ACUTE ONLY): 22 min   Charges:   PT Evaluation $Initial PT Evaluation Tier I: 1 Procedure     PT G CodesEllouise Newer 02/14/2015, 4:44 PM Camille Bal Gering, [place]

## 2015-02-14 NOTE — Anesthesia Procedure Notes (Signed)
Procedure Name: Intubation Date/Time: 02/14/2015 9:31 AM Performed by: Salli Quarry Vitoria Conyer Pre-anesthesia Checklist: Patient identified, Emergency Drugs available, Suction available and Patient being monitored Patient Re-evaluated:Patient Re-evaluated prior to inductionOxygen Delivery Method: Circle system utilized Preoxygenation: Pre-oxygenation with 100% oxygen Intubation Type: IV induction Ventilation: Mask ventilation without difficulty and Oral airway inserted - appropriate to patient size Laryngoscope Size: Mac and 4 Grade View: Grade I Tube type: Oral Tube size: 8.0 mm Number of attempts: 1 Airway Equipment and Method: Stylet Placement Confirmation: ETT inserted through vocal cords under direct vision,  CO2 detector and breath sounds checked- equal and bilateral Secured at: 23 cm Tube secured with: Tape Dental Injury: Teeth and Oropharynx as per pre-operative assessment

## 2015-02-14 NOTE — Discharge Instructions (Signed)
°Dr. Nayali Talerico °Joint Replacement Specialist °[facility] °[address] °[place], [place] [address] °[phone] ° ° °TOTAL HIP REPLACEMENT POSTOPERATIVE DIRECTIONS ° ° ° °Hip Rehabilitation, Guidelines Following Surgery  ° °WEIGHT BEARING °Weight bearing as tolerated with assist device (walker, cane, etc) as directed, use it as long as suggested by your surgeon or therapist, typically at least 4-6 weeks. ° °The results of a hip operation are greatly improved after range of motion and muscle strengthening exercises. Follow all safety measures which are given to protect your hip. If any of these exercises cause increased pain or swelling in your joint, decrease the amount until you are comfortable again. Then slowly increase the exercises. Call your caregiver if you have problems or questions.  ° °HOME CARE INSTRUCTIONS  °Most of the following instructions are designed to prevent the dislocation of your new hip.  °Remove items at home which could result in a fall. This includes throw rugs or furniture in walking pathways.  °Continue medications as instructed at time of discharge. °· You may have some home medications which will be placed on hold until you complete the course of blood thinner medication. °· You may start showering once you are discharged home. Do not remove your dressing. °Do not put on socks or shoes without following the instructions of your caregivers.   °Sit on chairs with arms. Use the chair arms to help push yourself up when arising.  °Arrange for the use of a toilet seat elevator so you are not sitting low.  °· Walk with walker as instructed.  °You may resume a sexual relationship in one month or when given the OK by your caregiver.  °Use walker as long as suggested by your caregivers.  °You may put full weight on your legs and walk as much as is comfortable. °Avoid periods of inactivity such as sitting longer than an hour when not asleep. This helps prevent  blood clots.  °You may return to work once you are cleared by your surgeon.  °Do not drive a car for 6 weeks or until released by your surgeon.  °Do not drive while taking narcotics.  °Wear elastic stockings for two weeks following surgery during the day but you may remove then at night.  °Make sure you keep all of your appointments after your operation with all of your doctors and caregivers. You should call the office at the above phone number and make an appointment for approximately two weeks after the date of your surgery. °Please pick up a stool softener and laxative for home use as long as you are requiring pain medications. °· ICE to the affected hip every three hours for 30 minutes at a time and then as needed for pain and swelling. Continue to use ice on the hip for pain and swelling from surgery. You may notice swelling that will progress down to the foot and ankle.  This is normal after surgery.  Elevate the leg when you are not up walking on it.   °It is important for you to complete the blood thinner medication as prescribed by your doctor. °· Continue to use the breathing machine which will help keep your temperature down.  It is common for your temperature to cycle up and down following surgery, especially at night when you are not up moving around and exerting yourself.  The breathing machine keeps your lungs expanded and your temperature down. ° °RANGE OF MOTION AND STRENGTHENING EXERCISES  °These exercises are   designed to help you keep full movement of your hip joint. Follow your caregiver's or physical therapist's instructions. Perform all exercises about fifteen times, three times per day or as directed. Exercise both hips, even if you have had only one joint replacement. These exercises can be done on a training (exercise) mat, on the floor, on a table or on a bed. Use whatever works the best and is most comfortable for you. Use music or television while you are exercising so that the exercises  are a pleasant break in your day. This will make your life better with the exercises acting as a break in routine you can look forward to.  °Lying on your back, slowly slide your foot toward your buttocks, raising your knee up off the floor. Then slowly slide your foot back down until your leg is straight again.  °Lying on your back spread your legs as far apart as you can without causing discomfort.  °Lying on your side, raise your upper leg and foot straight up from the floor as far as is comfortable. Slowly lower the leg and repeat.  °Lying on your back, tighten up the muscle in the front of your thigh (quadriceps muscles). You can do this by keeping your leg straight and trying to raise your heel off the floor. This helps strengthen the largest muscle supporting your knee.  °Lying on your back, tighten up the muscles of your buttocks both with the legs straight and with the knee bent at a comfortable angle while keeping your heel on the floor.  ° °SKILLED REHAB INSTRUCTIONS: °If the patient is transferred to a skilled rehab facility following release from the hospital, a list of the current medications will be sent to the facility for the patient to continue.  When discharged from the skilled rehab facility, please have the facility set up the patient's Home Health Physical Therapy prior to being released. Also, the skilled facility will be responsible for providing the patient with their medications at time of release from the facility to include their pain medication and their blood thinner medication. If the patient is still at the rehab facility at time of the two week follow up appointment, the skilled rehab facility will also need to assist the patient in arranging follow up appointment in our office and any transportation needs. ° °MAKE SURE YOU:  °Understand these instructions.  °Will watch your condition.  °Will get help right away if you are not doing well or get worse. ° °Pick up stool softner and  laxative for home use following surgery while on pain medications. °Do not remove your dressing. °The dressing is waterproof--it is OK to take showers. °Continue to use ice for pain and swelling after surgery. °Do not use any lotions or creams on the incision until instructed by your surgeon. °Total Hip Protocol. ° ° °

## 2015-02-14 NOTE — Op Note (Signed)
OPERATIVE REPORT  SURGEON: Rod Can, MD   ASSISTANT: April Green, RNFA.  PREOPERATIVE DIAGNOSIS: Left hip arthritis.   POSTOPERATIVE DIAGNOSIS: Left hip arthritis.   PROCEDURE: Left total hip arthroplasty, anterior approach.   IMPLANTS: DePuy Tri Lock stem, size 7, std offset. DePuy Pinnacle Cup, size 56 mm. DePuy Altrx liner, size 36 by 56 mm, +4 neutral. DePuy Biolox ceramic head ball, size 36 + 1.5 mm.  ANESTHESIA:  General  ESTIMATED BLOOD LOSS: 400 mL.  ANTIBIOTICS: 3g ancef.  DRAINS: None.  COMPLICATIONS: None.   CONDITION: PACU - hemodynamically stable.Marland Kitchen   BRIEF CLINICAL NOTE: Jesus Tapia is a 49 y.o. male with a long-standing history of Left hip arthritis. After failing conservative management, the patient was indicated for total hip arthroplasty. The risks, benefits, and alternatives to the procedure were explained, and the patient elected to proceed.  PROCEDURE IN DETAIL: Surgical site was marked by myself. Spinal anesthesia was obtained in the pre-op holding area. Once inside the operative room, a foley catheter was inserted. The patient was then positioned on the Hana table. All bony prominences were well padded. The hip was prepped and draped in the normal sterile surgical fashion. A time-out was called verifying side and site of surgery. The patient received IV antibiotics within 60 minutes of beginning the procedure.  The direct anterior approach to the hip was performed through the Hueter interval. Lateral femoral circumflex vessels were treated with the Auqumantys. The anterior capsule was exposed and an inverted T capsulotomy was made.The femoral neck cut was made to the level of the templated cut. A corkscrew was placed into the head and the head was removed. The femoral head was found to have eburnated bone. The head was passed to the back table and was measured.  Acetabular exposure was achieved, and the pulvinar and labrum were  excised. Sequental reaming of the acetabulum was then performed up to a size 55 mm reamer. A 56 mm cup was then opened and impacted into place at approximately 40 degrees of abduction and 20 degrees of anteversion. The final polyethylene liner was impacted into place and acetabular osteophytes were removed.   I then gained femoral exposure taking care to protect the abductors and greater trochanter. This was performed using standard external rotation, extension, and adduction. The capsule was peeled off the inner aspect of the greater trochanter, taking care to preserve the short external rotators. A cookie cutter was used to enter the femoral canal, and then the femoral canal finder was placed. Sequential broaching was performed up to a size 7. Calcar planer was used on the femoral neck remnant. I paced a std offset neck and a trial head ball. The hip was reduced. Leg lengths and offset were checked fluoroscopically. The hip was dislocated and trial components were removed. The final implants were placed, and the hip was reduced.  Fluoroscopy was used to confirm component position and leg lengths. At 90 degrees of external rotation and full extension, the hip was stable to an anterior directed force.  The wound was copiously irrigated with a dilute betadine solution followed by normal saline. Marcaine solution was injected into the periarticular soft tissue. The wound was closed in layers using #1 Vicryl and V-Loc for the fascia, 2-0 Vicryl for the subcutaneous fat, 2-0 Monocryl for the deep dermal layer, 3-0 running Monocryl subcuticular stitch, and Dermabond for the skin. Once the glue was fully dried, an Aquacell Ag dressing was applied. The patient was transported to the recovery  room in stable condition. Sponge, needle, and instrument counts were correct at the end of the case x2. The patient tolerated the procedure well and there were no known complications.

## 2015-02-15 LAB — BASIC METABOLIC PANEL
Anion gap: 8 (ref 5–15)
BUN: 12 mg/dL (ref 6–20)
CHLORIDE: 105 mmol/L (ref 101–111)
CO2: 25 mmol/L (ref 22–32)
CREATININE: 1.13 mg/dL (ref 0.61–1.24)
Calcium: 8 mg/dL — ABNORMAL LOW (ref 8.9–10.3)
GFR calc Af Amer: >60 mL/min (ref >60)
Glucose, Bld: 116 mg/dL — ABNORMAL HIGH (ref 65–99)
POTASSIUM: 3.5 mmol/L (ref 3.5–5.1)
Sodium: 138 mmol/L (ref 135–145)

## 2015-02-15 LAB — CBC
HCT: 34.5 % — ABNORMAL LOW (ref 39.0–52.0)
Hemoglobin: 11.3 g/dL — ABNORMAL LOW (ref 13.0–17.0)
MCH: 28.5 pg (ref 26.0–34.0)
MCHC: 32.8 g/dL (ref 30.0–36.0)
MCV: 86.9 fL (ref 78.0–100.0)
PLATELETS: 300 10*3/uL (ref 150–400)
RBC: 3.97 MIL/uL — AB (ref 4.22–5.81)
RDW: 14 % (ref 11.5–15.5)
WBC: 12.3 10*3/uL — ABNORMAL HIGH (ref 4.0–10.5)

## 2015-02-15 MED ORDER — ONDANSETRON HCL 4 MG PO TABS: 4.0000 mg | ORAL_TABLET | Freq: Four times a day (QID) | ORAL | Status: DC | PRN

## 2015-02-15 MED ORDER — DOCUSATE SODIUM 100 MG PO CAPS: 100.0000 mg | ORAL_CAPSULE | Freq: Two times a day (BID) | ORAL | Status: DC

## 2015-02-15 MED ORDER — SENNA 8.6 MG PO TABS: 2.0000 | ORAL_TABLET | Freq: Every day | ORAL | Status: DC

## 2015-02-15 MED ORDER — METHOCARBAMOL 500 MG PO TABS: 500.0000 mg | ORAL_TABLET | Freq: Four times a day (QID) | ORAL | Status: DC | PRN

## 2015-02-15 MED ORDER — MELOXICAM 15 MG PO TABS: 15.0000 mg | ORAL_TABLET | Freq: Every day | ORAL | Status: DC

## 2015-02-15 MED ORDER — ASPIRIN EC 325 MG PO TBEC: 325.0000 mg | DELAYED_RELEASE_TABLET | Freq: Two times a day (BID) | ORAL | Status: DC

## 2015-02-15 MED ORDER — HYDROCODONE-ACETAMINOPHEN 5-325 MG PO TABS: 1.0000 | ORAL_TABLET | ORAL | Status: DC | PRN

## 2015-02-15 NOTE — Progress Notes (Signed)
Physical Therapy Treatment Patient Details Name: Jesus Tapia MRN: [id number] DOB: Oct 24, 1965 Today's Date: 2015-03-01    History of Present Illness 49 y.o. male s/p left total hip arthroplasty. Hx of HTN and Anxiety.    PT Comments    Mr.Stroh with excellent progression with am able to ambulate, perform stairs and HEP. Encouraged mobility with nursing assist as well as continuation of HEP. Ice end of session and pt with no pain. Will continue to follow.   Follow Up Recommendations  Home health PT;Supervision for mobility/OOB     Equipment Recommendations       Recommendations for Other Services       Precautions / Restrictions Precautions Precautions: Fall Precaution Comments: direct anterior approach, no precautions Restrictions LLE Weight Bearing: Weight bearing as tolerated    Mobility  Bed Mobility Overal bed mobility: Needs Assistance Bed Mobility: Supine to Sit     Supine to sit: Supervision     General bed mobility comments: increased time with cues for use of RLE to assist LLE, HOB 20 degrees and reliance on rail to sit  Transfers Overall transfer level: Needs assistance   Transfers: Sit to/from Stand Sit to Stand: Supervision         General transfer comment: cues for sequence, hand placement and LLE position  Ambulation/Gait Ambulation/Gait assistance: Supervision Ambulation Distance (Feet): 75 Feet Assistive device: Rolling walker (2 wheeled) Gait Pattern/deviations: Step-to pattern     General Gait Details: cues for sequence and posture   Stairs Stairs: Yes Stairs assistance: Supervision Stair Management: Step to pattern;Sideways;One rail Left Number of Stairs: 4 General stair comments: cues for sequence with good balance and strength   Wheelchair Mobility    Modified Rankin (Stroke Patients Only)       Balance                                    Cognition Arousal/Alertness: Awake/alert Behavior During  Therapy: WFL for tasks assessed/performed Overall Cognitive Status: Within Functional Limits for tasks assessed                      Exercises Total Joint Exercises Heel Slides: AAROM;Seated;Left;10 reps Hip ABduction/ADduction: AROM;Standing;Left;10 reps Long Arc Quad: AROM;Seated;Left;10 reps Marching in Standing: AROM;Standing;Left;10 reps    General Comments        Pertinent Vitals/Pain Pain Assessment: No/denies pain    Home Living                      Prior Function            PT Goals (current goals can now be found in the care plan section) Progress towards PT goals: Progressing toward goals    Frequency       PT Plan Current plan remains appropriate    Co-evaluation             End of Session   Activity Tolerance: Patient tolerated treatment well Patient left: in chair;with call bell/phone within reach;with family/visitor present     Time: [id number] PT Time Calculation (min) (ACUTE ONLY): 21 min  Charges:  $Gait Training: 8-22 mins                    G Codes:      Melford Aase 2015-03-01, 8:36 AM Elwyn Reach, [place]

## 2015-02-15 NOTE — Care Management Note (Signed)
Case Management Note  Patient Details  Name: KALU SHAMI MRN: [id number] Date of Birth: September 12, 1965  Subjective/Objective:   49 yr old male s/p left total hip arthroplasty.               Action/Plan:  Case manager spoke with patient's wife concerning home health and DME needs at discharge. Choice was offered. Referral was called to [place]. Patient will have family support at discharge.     Expected Discharge Date:   02/15/15               Expected Discharge Plan:   home with home health  In-House Referral:  NA  Discharge planning Services  CM Consult  Post Acute Care Choice:  Durable Medical Equipment, Home Health Choice offered to:  Spouse  DME Arranged:  3-N-1, Walker rolling DME Agency:  [place]:  PT [place]:  [place]  Status of Service:  Completed, signed off  Medicare Important Message Given:    Date Medicare IM Given:    Medicare IM give by:    Date Additional Medicare IM Given:    Additional Medicare Important Message give by:     If discussed at [place] of Stay Meetings, dates discussed:    Additional Comments:  Ninfa Meeker, RN 02/15/2015, 12:30 PM

## 2015-02-15 NOTE — Progress Notes (Signed)
   Subjective:  Patient reports pain as mild.  No c/o. Worked with PT/OT and wants to go home.  Objective:   VITALS:   Filed Vitals:   02/14/15 1403 02/14/15 2111 02/15/15 0011 02/15/15 0412  BP: 119/63 115/70 122/73 109/67  Pulse:  96 95 79  Temp: 97.6 F (36.4 C) 99.2 F (37.3 C) 99.2 F (37.3 C) 98.7 F (37.1 C)  TempSrc: Oral Oral Oral Oral  Resp: 16 18 16 16   Height:      Weight:      SpO2: 97% 93% 92% 93%    ABD soft Intact pulses distally Dorsiflexion/Plantar flexion intact Incision: dressing C/D/I Compartment soft   Lab Results  Component Value Date   WBC 12.3* 02/15/2015   HGB 11.3* 02/15/2015   HCT 34.5* 02/15/2015   MCV 86.9 02/15/2015   PLT 300 02/15/2015   BMET    Component Value Date/Time   NA 138 02/15/2015 0454   K 3.5 02/15/2015 0454   CL 105 02/15/2015 0454   CO2 25 02/15/2015 0454   GLUCOSE 116* 02/15/2015 0454   BUN 12 02/15/2015 0454   CREATININE 1.13 02/15/2015 0454   CALCIUM 8.0* 02/15/2015 0454   GFRNONAA >60 02/15/2015 0454   GFRAA >60 02/15/2015 0454     Assessment/Plan: 1 Day Post-Op   Principal Problem:   Osteoarthritis of left hip   WBAT with walker DVT ppx: ASA, SCDs, TEDs PO pain control Pt/OT Discharge home with home health    Ronal Maybury, Horald Pollen 02/15/2015, 11:05 AM   Rod Can, MD Cell [phone]

## 2015-02-15 NOTE — Evaluation (Signed)
Occupational Therapy Evaluation Patient Details Name: Jesus Tapia MRN: [id number] DOB: 1965-06-30 Today's Date: 02/15/2015    History of Present Illness 49 y.o. male s/p left total hip arthroplasty. Hx of HTN and Anxiety.   Clinical Impression   Pt reports he was independent with ADLs PTA. Currently pt is overall supervision for ADLs with the exception of min A for LB ADLs. All education complete; pt and wife with no further questions or concerns for OT at this time. Pt planning to d/c home with 24/7 supervision from family. Pt ready to d/c from an OT standpoint; signing off at this time. Thank you for this referral.     Follow Up Recommendations  No OT follow up;Supervision - Intermittent    Equipment Recommendations  3 in 1 bedside comode    Recommendations for Other Services       Precautions / Restrictions Precautions Precautions: Fall Precaution Comments: direct anterior approach, no precautions Restrictions Weight Bearing Restrictions: Yes LLE Weight Bearing: Weight bearing as tolerated      Mobility Bed Mobility Overal bed mobility: Needs Assistance Bed Mobility: Supine to Sit     Supine to sit: Supervision     General bed mobility comments: OOB in chair  Transfers Overall transfer level: Needs assistance Equipment used: Rolling walker (2 wheeled) Transfers: Sit to/from Stand Sit to Stand: Supervision         General transfer comment: Supervision for safety, no physical assist needed. Good hand placement and technique.     Balance Overall balance assessment: Needs assistance         Standing balance support: Bilateral upper extremity supported Standing balance-Leahy Scale: Fair Standing balance comment: RW for support                            ADL Overall ADL's : Needs assistance/impaired Eating/Feeding: Set up;Sitting   Grooming: Supervision/safety;Standing       Lower Body Bathing: Minimal assistance;Sit to/from  stand       Lower Body Dressing: Minimal assistance;Sit to/from stand Lower Body Dressing Details (indicate cue type and reason): Educated on compensatory strategies for LB ADLs; pt verbalized understanding. Discussed use of AE for incresed independence with LB ADLs; pt declined stating wife or children could help as needed. Toilet Transfer: Supervision/safety;Ambulation;BSC;RW (BSC over toilet)   Toileting- Clothing Manipulation and Hygiene: Supervision/safety;Sit to/from stand   Tub/ Shower Transfer: Supervision/safety;Walk-in shower;Ambulation;Rolling walker Tub/Shower Transfer Details (indicate cue type and reason): Educated on walk in shower transfer technique; pt able to return verbalize and demonstrate technique. Functional mobility during ADLs: Supervision/safety;Rolling walker General ADL Comments: Wife present for OT session. Educated on edema management techniques, use of 3 in 1 over toilet and in shower, need for supervision for safety during ADLs and mobility, safety with RW; pt verbalized understanding.     Vision     Perception     Praxis      Pertinent Vitals/Pain Pain Assessment: No/denies pain     Hand Dominance Right   Extremity/Trunk Assessment Upper Extremity Assessment Upper Extremity Assessment: Overall WFL for tasks assessed   Lower Extremity Assessment Lower Extremity Assessment: Defer to PT evaluation   Cervical / Trunk Assessment Cervical / Trunk Assessment: Normal   Communication Communication Communication: No difficulties   Cognition Arousal/Alertness: Awake/alert Behavior During Therapy: WFL for tasks assessed/performed Overall Cognitive Status: Within Functional Limits for tasks assessed  General Comments       Exercises       Shoulder Instructions      Home Living Family/patient expects to be discharged to:: Private residence Living Arrangements: Spouse/significant other Available Help at Discharge:  Family;Available 24 hours/day Type of Home: House Home Access: Stairs to enter [employer] of Steps: 2 Entrance Stairs-Rails: Right;Left Home Layout: One level     Bathroom Shower/Tub: Walk-in Hydrologist: Standard Bathroom Accessibility: Yes How Accessible: Accessible via walker Home Equipment: None          Prior Functioning/Environment Level of Independence: Independent             OT Diagnosis: Acute pain   OT Problem List:     OT Treatment/Interventions:      OT Goals(Current goals can be found in the care plan section) Acute Rehab OT Goals Patient Stated Goal: To go home today  OT Goal Formulation: With patient/family  OT Frequency:     Barriers to D/C:            Co-evaluation              End of Session Equipment Utilized During Treatment: Gait belt;Rolling walker  Activity Tolerance: Patient tolerated treatment well Patient left: in chair;with call bell/phone within reach;with family/visitor present   Time: 0920-0932 OT Time Calculation (min): 12 min Charges:  OT General Charges $OT Visit: 1 Procedure OT Evaluation $Initial OT Evaluation Tier I: 1 Procedure G-Codes:     Binnie Kand M.S., OTR/L Pager: [phone]  02/15/2015, 9:55 AM

## 2015-02-15 NOTE — Progress Notes (Signed)
Physical Therapy Treatment Patient Details Name: Jesus Tapia MRN: [id number] DOB: 02/10/1966 Today's Date: 02/15/2015    History of Present Illness 49 y.o. male s/p left total hip arthroplasty. Hx of HTN and Anxiety.    PT Comments    Pt progressing well with increased gait and repetition tolerance with HEP. Pt educated for transfers and safety and verbalizes understanding. Pt has handout for HEP and all questions answered. Safe for D/C home.   Follow Up Recommendations  Home health PT;Supervision for mobility/OOB     Equipment Recommendations       Recommendations for Other Services       Precautions / Restrictions Precautions Precautions: Fall Precaution Comments: direct anterior approach, no precautions Restrictions Weight Bearing Restrictions: Yes LLE Weight Bearing: Weight bearing as tolerated    Mobility  Bed Mobility               General bed mobility comments: OOB in chair  Transfers Overall transfer level: Needs assistance Equipment used: Rolling walker (2 wheeled) Transfers: Sit to/from Stand Sit to Stand: Supervision         General transfer comment: cues for safety and LLE placement  Ambulation/Gait Ambulation/Gait assistance: Supervision Ambulation Distance (Feet): 115 Feet Assistive device: Rolling walker (2 wheeled) Gait Pattern/deviations: Step-through pattern;Decreased stride length   Gait velocity interpretation: Below normal speed for age/gender General Gait Details: cues for sequence and posture   Stairs            Wheelchair Mobility    Modified Rankin (Stroke Patients Only)       Balance Overall balance assessment: Needs assistance         Standing balance support: Bilateral upper extremity supported Standing balance-Leahy Scale: Fair Standing balance comment: RW for support                    Cognition Arousal/Alertness: Awake/alert Behavior During Therapy: WFL for tasks  assessed/performed Overall Cognitive Status: Within Functional Limits for tasks assessed                      Exercises Total Joint Exercises Hip ABduction/ADduction: AROM;Standing;Left;15 reps Long Arc Quad: AROM;Seated;Left;15 reps Knee Flexion: AROM;Standing;Left;15 reps Marching in Standing: AROM;Standing;Left;15 reps    General Comments        Pertinent Vitals/Pain Pain Assessment: No/denies pain    Home Living Family/patient expects to be discharged to:: Private residence Living Arrangements: Spouse/significant other Available Help at Discharge: Family;Available 24 hours/day Type of Home: House Home Access: Stairs to enter Entrance Stairs-Rails: Right;Left Home Layout: One level Home Equipment: None      Prior Function Level of Independence: Independent          PT Goals (current goals can now be found in the care plan section) Acute Rehab PT Goals Patient Stated Goal: To go home today  Progress towards PT goals: Progressing toward goals    Frequency       PT Plan Current plan remains appropriate    Co-evaluation             End of Session   Activity Tolerance: Patient tolerated treatment well Patient left: in chair;with call bell/phone within reach;with family/visitor present     Time: 1222-1234 PT Time Calculation (min) (ACUTE ONLY): 12 min  Charges:  $Gait Training: 8-22 mins                    G Codes:      Melford Aase  02/15/2015, 12:38 PM Elwyn Reach, [place]

## 2015-02-18 ENCOUNTER — Encounter (HOSPITAL_COMMUNITY): Payer: Self-pay | Admitting: Orthopedic Surgery

## 2016-12-02 IMAGING — CR DG PORTABLE PELVIS
1 series · 1 of 1 positions shown · non-contrast
Comparison: Intraoperative exam.

CLINICAL DATA: 49-year-old male post left hip replacement.
Subsequent encounter.

EXAM:
PORTABLE PELVIS 1-2 VIEWS

[AP]
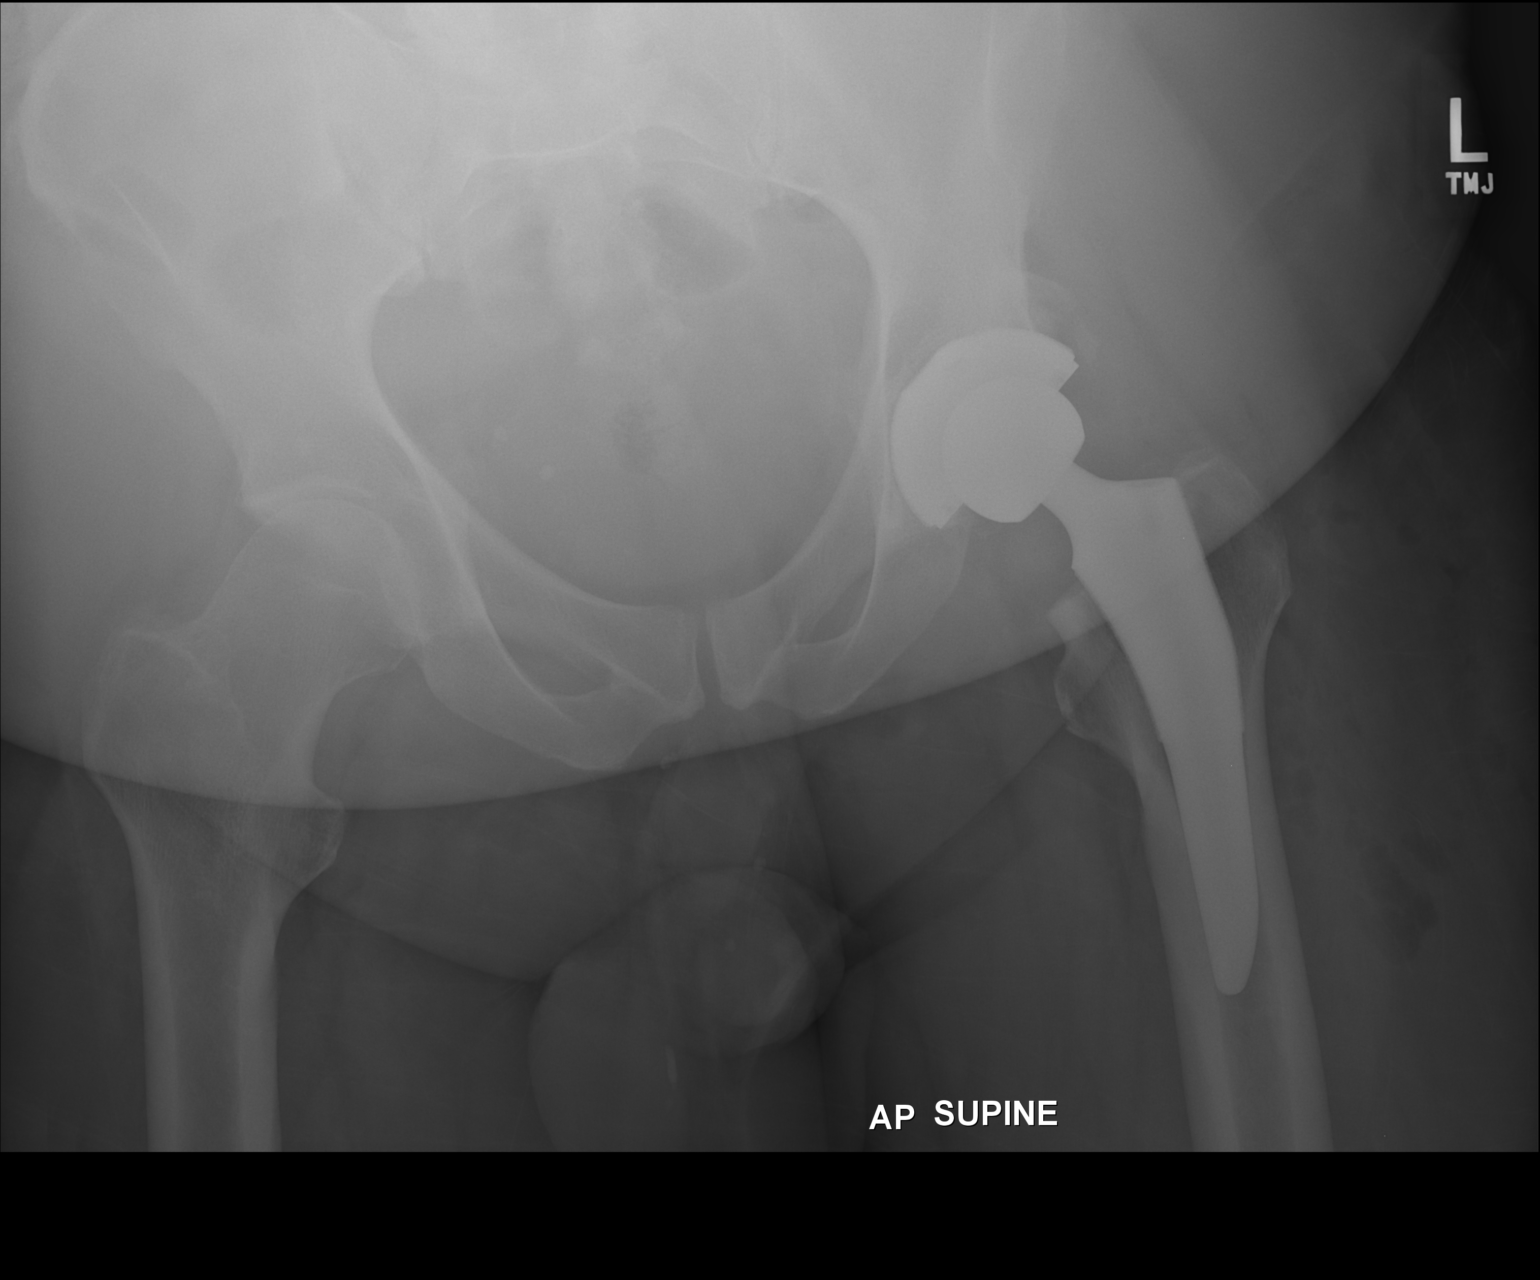

[1 of 1 positions shown; findings below may reference images not displayed]

FINDINGS: AP supine view of the pelvis reveals total left hip replacement in
satisfactory position on this single projection. Bony overgrowth
superior margin of the acetabulum may reflect changes from preop of
degenerative disease and can be assessed on followup.
IMPRESSION: Post total left hip replacement appearing in satisfactory position
on this single projection.

Bony overgrowth superior margin of the acetabulum may reflect
changes from preop of degenerative disease and can be assessed on
followup.

## 2017-10-15 ENCOUNTER — Other Ambulatory Visit: Payer: Self-pay | Admitting: Physician Assistant

## 2019-07-11 ENCOUNTER — Encounter (INDEPENDENT_AMBULATORY_CARE_PROVIDER_SITE_OTHER): Payer: Self-pay | Admitting: *Deleted

## 2019-08-01 ENCOUNTER — Encounter (INDEPENDENT_AMBULATORY_CARE_PROVIDER_SITE_OTHER): Payer: Self-pay | Admitting: *Deleted

## 2019-08-03 ENCOUNTER — Other Ambulatory Visit (INDEPENDENT_AMBULATORY_CARE_PROVIDER_SITE_OTHER): Payer: Self-pay | Admitting: *Deleted

## 2019-08-03 ENCOUNTER — Telehealth (INDEPENDENT_AMBULATORY_CARE_PROVIDER_SITE_OTHER): Payer: Self-pay | Admitting: *Deleted

## 2019-08-03 ENCOUNTER — Ambulatory Visit (INDEPENDENT_AMBULATORY_CARE_PROVIDER_SITE_OTHER): Payer: Self-pay

## 2019-08-03 ENCOUNTER — Other Ambulatory Visit: Payer: Self-pay

## 2019-08-03 DIAGNOSIS — Z1211 Encounter for screening for malignant neoplasm of colon: Secondary | ICD-10-CM

## 2019-08-03 NOTE — Telephone Encounter (Signed)
Referring MD/PCP: rucker   Procedure: tcs with propofol  Reason/Indication:  screening  Has patient had this procedure before?  no  If so, when, by whom and where?    Is there a family history of colon cancer?  no  Who?  What age when diagnosed?    Is patient diabetic?   no      Does patient have prosthetic heart valve or mechanical valve?  no  Do you have a pacemaker/defibrillator?  no  Has patient ever had endocarditis/atrial fibrillation? no  Does patient use oxygen? no  Has patient had joint replacement within last 12 months?  no  Is patient constipated or do they take laxatives? no  Does patient have a history of alcohol/drug use?  no  Is patient on blood thinner such as Coumadin, Plavix and/or Aspirin? no  Medications: amlodipine 10 mg daily, paroxetine 30 mg daily  Allergies: nkda  Medication Adjustment per Dr Laural Golden:   Procedure date & time: 08/25/19

## 2019-08-04 ENCOUNTER — Telehealth (INDEPENDENT_AMBULATORY_CARE_PROVIDER_SITE_OTHER): Payer: Self-pay | Admitting: *Deleted

## 2019-08-04 MED ORDER — SUTAB 1479-225-188 MG PO TABS: 1.0000 | ORAL_TABLET | Freq: Once | ORAL | 0 refills | Status: AC

## 2019-08-04 NOTE — Telephone Encounter (Signed)
Ok to schedule.

## 2019-08-04 NOTE — Telephone Encounter (Signed)
Patient needs S[employer] (copay card) ° °

## 2019-08-21 NOTE — Patient Instructions (Signed)
Jesus Tapia  08/21/2019     @PREFPERIOPPHARMACY @   Your procedure is scheduled on  08/25/2019  Report to [facility] at  [place].M.  Call this number if you have problems the morning of surgery:  [phone]   Remember:  Follow the diet and prep instructions given to you by Dr Olevia Perches office.                    Take these medicines the morning of surgery with A SIP OF WATER  Mlodipine, hydrocodone(if needed) or mobic (if needed), robaxin(if needed), zofran(if needed), paxil/    Do not wear jewelry, make-up or nail polish.  Do not wear lotions, powders, or perfumes. Please wear deodorant and brush your teeth.  Do not shave 48 hours prior to surgery.  Men may shave face and neck.  Do not bring valuables to the hospital.  [facility] is not responsible for any belongings or valuables.  Contacts, dentures or bridgework may not be worn into surgery.  Leave your suitcase in the car.  After surgery it may be brought to your room.  For patients admitted to the hospital, discharge time will be determined by your treatment team.  Patients discharged the day of surgery will not be allowed to drive home.   Name and phone number of your driver:   family Special instructions:  DO NOT smoke the morning of your procedure.  Please read over the following fact sheets that you were given. Anesthesia Post-op Instructions and Care and Recovery After Surgery       Colonoscopy, Adult, Care After This sheet gives you information about how to care for yourself after your procedure. Your health care provider may also give you more specific instructions. If you have problems or questions, contact your health care provider. What can I expect after the procedure? After the procedure, it is common to have:  A small amount of blood in your stool for 24 hours after the procedure.  Some gas.  Mild cramping or bloating of your abdomen. Follow these instructions at home: Eating and  drinking   Drink enough fluid to keep your urine pale yellow.  Follow instructions from your health care provider about eating or drinking restrictions.  Resume your normal diet as instructed by your health care provider. Avoid heavy or fried foods that are hard to digest. Activity  Rest as told by your health care provider.  Avoid sitting for a long time without moving. Get up to take short walks every 1-2 hours. This is important to improve blood flow and breathing. Ask for help if you feel weak or unsteady.  Return to your normal activities as told by your health care provider. Ask your health care provider what activities are safe for you. Managing cramping and bloating   Try walking around when you have cramps or feel bloated.  Apply heat to your abdomen as told by your health care provider. Use the heat source that your health care provider recommends, such as a moist heat pack or a heating pad. ? Place a towel between your skin and the heat source. ? Leave the heat on for 20-30 minutes. ? Remove the heat if your skin turns bright red. This is especially important if you are unable to feel pain, heat, or cold. You may have a greater risk of getting burned. General instructions  For the first 24 hours after the procedure: ? Do not  drive or use machinery. ? Do not sign important documents. ? Do not drink alcohol. ? Do your regular daily activities at a slower pace than normal. ? Eat soft foods that are easy to digest.  Take over-the-counter and prescription medicines only as told by your health care provider.  Keep all follow-up visits as told by your health care provider. This is important. Contact a health care provider if:  You have blood in your stool 2-3 days after the procedure. Get help right away if you have:  More than a small spotting of blood in your stool.  Large blood clots in your stool.  Swelling of your abdomen.  Nausea or vomiting.  A  fever.  Increasing pain in your abdomen that is not relieved with medicine. Summary  After the procedure, it is common to have a small amount of blood in your stool. You may also have mild cramping and bloating of your abdomen.  For the first 24 hours after the procedure, do not drive or use machinery, sign important documents, or drink alcohol.  Get help right away if you have a lot of blood in your stool, nausea or vomiting, a fever, or increased pain in your abdomen. This information is not intended to replace advice given to you by your health care provider. Make sure you discuss any questions you have with your health care provider. Document Revised: 10/17/2018 Document Reviewed: 10/17/2018 Elsevier Patient Education  [name] After These instructions provide you with information about caring for yourself after your procedure. Your health care provider may also give you more specific instructions. Your treatment has been planned according to current medical practices, but problems sometimes occur. Call your health care provider if you have any problems or questions after your procedure. What can I expect after the procedure? After your procedure, you may:  Feel sleepy for several hours.  Feel clumsy and have poor balance for several hours.  Feel forgetful about what happened after the procedure.  Have poor judgment for several hours.  Feel nauseous or vomit.  Have a sore throat if you had a breathing tube during the procedure. Follow these instructions at home: For at least 24 hours after the procedure:      Have a responsible adult stay with you. It is important to have someone help care for you until you are awake and alert.  Rest as needed.  Do not: ? Participate in activities in which you could fall or become injured. ? Drive. ? Use heavy machinery. ? Drink alcohol. ? Take sleeping pills or medicines that cause  drowsiness. ? Make important decisions or sign legal documents. ? Take care of children on your own. Eating and drinking  Follow the diet that is recommended by your health care provider.  If you vomit, drink water, juice, or soup when you can drink without vomiting.  Make sure you have little or no nausea before eating solid foods. General instructions  Take over-the-counter and prescription medicines only as told by your health care provider.  If you have sleep apnea, surgery and certain medicines can increase your risk for breathing problems. Follow instructions from your health care provider about wearing your sleep device: ? Anytime you are sleeping, including during daytime naps. ? While taking prescription pain medicines, sleeping medicines, or medicines that make you drowsy.  If you smoke, do not smoke without supervision.  Keep all follow-up visits as told by your health care provider. This  is important. Contact a health care provider if:  You keep feeling nauseous or you keep vomiting.  You feel light-headed.  You develop a rash.  You have a fever. Get help right away if:  You have trouble breathing. Summary  For several hours after your procedure, you may feel sleepy and have poor judgment.  Have a responsible adult stay with you for at least 24 hours or until you are awake and alert. This information is not intended to replace advice given to you by your health care provider. Make sure you discuss any questions you have with your health care provider. Document Revised: 06/21/2017 Document Reviewed: 07/14/2015 Elsevier Patient Education  [place].

## 2019-08-23 ENCOUNTER — Other Ambulatory Visit: Payer: Self-pay

## 2019-08-23 ENCOUNTER — Encounter (HOSPITAL_COMMUNITY)
Admission: RE | Admit: 2019-08-23 | Discharge: 2019-08-23 | Disposition: A | Discharge status: Home / Self Care | Payer: PRIVATE HEALTH INSURANCE | Source: Ambulatory Visit | Attending: Internal Medicine | Admitting: Internal Medicine

## 2019-08-23 ENCOUNTER — Other Ambulatory Visit (HOSPITAL_COMMUNITY)
Admission: RE | Admit: 2019-08-23 | Discharge: 2019-08-23 | Disposition: A | Discharge status: Home / Self Care | Payer: PRIVATE HEALTH INSURANCE | Source: Ambulatory Visit | Attending: Internal Medicine | Admitting: Internal Medicine

## 2019-08-23 DIAGNOSIS — Z20822 Contact with and (suspected) exposure to covid-19: Secondary | ICD-10-CM | POA: Insufficient documentation

## 2019-08-23 DIAGNOSIS — Z01812 Encounter for preprocedural laboratory examination: Secondary | ICD-10-CM | POA: Diagnosis not present

## 2019-08-24 LAB — SARS CORONAVIRUS 2 (TAT 6-24 HRS): SARS Coronavirus 2: NEGATIVE

## 2019-08-25 ENCOUNTER — Encounter (HOSPITAL_COMMUNITY)
Admission: RE | Disposition: A | Discharge status: Home / Self Care | Payer: Self-pay | Source: Ambulatory Visit | Attending: Internal Medicine

## 2019-08-25 ENCOUNTER — Encounter (HOSPITAL_COMMUNITY): Payer: Self-pay | Admitting: Internal Medicine

## 2019-08-25 ENCOUNTER — Ambulatory Visit (HOSPITAL_COMMUNITY): Payer: PRIVATE HEALTH INSURANCE | Admitting: Anesthesiology

## 2019-08-25 ENCOUNTER — Ambulatory Visit (HOSPITAL_COMMUNITY)
Admission: RE | Admit: 2019-08-25 | Discharge: 2019-08-25 | Disposition: A | Discharge status: Home / Self Care | Payer: PRIVATE HEALTH INSURANCE | Source: Ambulatory Visit | Attending: Internal Medicine | Admitting: Internal Medicine

## 2019-08-25 DIAGNOSIS — Z7982 Long term (current) use of aspirin: Secondary | ICD-10-CM | POA: Diagnosis not present

## 2019-08-25 DIAGNOSIS — K6289 Other specified diseases of anus and rectum: Secondary | ICD-10-CM | POA: Diagnosis not present

## 2019-08-25 DIAGNOSIS — G4733 Obstructive sleep apnea (adult) (pediatric): Secondary | ICD-10-CM | POA: Insufficient documentation

## 2019-08-25 DIAGNOSIS — D125 Benign neoplasm of sigmoid colon: Secondary | ICD-10-CM

## 2019-08-25 DIAGNOSIS — Z79899 Other long term (current) drug therapy: Secondary | ICD-10-CM | POA: Insufficient documentation

## 2019-08-25 DIAGNOSIS — F419 Anxiety disorder, unspecified: Secondary | ICD-10-CM | POA: Diagnosis not present

## 2019-08-25 DIAGNOSIS — K644 Residual hemorrhoidal skin tags: Secondary | ICD-10-CM | POA: Insufficient documentation

## 2019-08-25 DIAGNOSIS — I10 Essential (primary) hypertension: Secondary | ICD-10-CM | POA: Diagnosis not present

## 2019-08-25 DIAGNOSIS — G4730 Sleep apnea, unspecified: Secondary | ICD-10-CM | POA: Insufficient documentation

## 2019-08-25 DIAGNOSIS — Z1211 Encounter for screening for malignant neoplasm of colon: Secondary | ICD-10-CM

## 2019-08-25 DIAGNOSIS — D123 Benign neoplasm of transverse colon: Secondary | ICD-10-CM | POA: Insufficient documentation

## 2019-08-25 DIAGNOSIS — Z791 Long term (current) use of non-steroidal anti-inflammatories (NSAID): Secondary | ICD-10-CM | POA: Diagnosis not present

## 2019-08-25 DIAGNOSIS — E669 Obesity, unspecified: Secondary | ICD-10-CM | POA: Insufficient documentation

## 2019-08-25 HISTORY — PX: POLYPECTOMY: SHX5525

## 2019-08-25 HISTORY — PX: COLONOSCOPY WITH PROPOFOL: SHX5780

## 2019-08-25 SURGERY — COLONOSCOPY WITH PROPOFOL
Anesthesia: General

## 2019-08-25 MED ORDER — PHENYLEPHRINE 40 MCG/ML (10ML) SYRINGE FOR IV PUSH (FOR BLOOD PRESSURE SUPPORT)
PREFILLED_SYRINGE | INTRAVENOUS | Status: AC
  Filled 2019-08-25: qty 10

## 2019-08-25 MED ORDER — PROPOFOL 500 MG/50ML IV EMUL
INTRAVENOUS | Status: DC | PRN
  Administered 2019-08-25: 150 ug/kg/min via INTRAVENOUS

## 2019-08-25 MED ORDER — PHENYLEPHRINE HCL (PRESSORS) 10 MG/ML IV SOLN
INTRAVENOUS | Status: DC | PRN
  Administered 2019-08-25 (×3): 100 ug via INTRAVENOUS

## 2019-08-25 MED ORDER — LACTATED RINGERS IV SOLN
INTRAVENOUS | Status: DC
  Administered 2019-08-25: 1000 mL via INTRAVENOUS

## 2019-08-25 MED ORDER — CHLORHEXIDINE GLUCONATE CLOTH 2 % EX PADS: 6.0000 | MEDICATED_PAD | Freq: Once | CUTANEOUS | Status: DC

## 2019-08-25 MED ORDER — PROPOFOL 10 MG/ML IV BOLUS
INTRAVENOUS | Status: AC
  Filled 2019-08-25: qty 40

## 2019-08-25 MED ORDER — PROPOFOL 10 MG/ML IV BOLUS
INTRAVENOUS | Status: DC | PRN
  Administered 2019-08-25 (×2): 50 mg via INTRAVENOUS

## 2019-08-25 MED ORDER — PROPOFOL 10 MG/ML IV BOLUS
INTRAVENOUS | Status: AC
  Filled 2019-08-25: qty 20

## 2019-08-25 NOTE — H&P (Addendum)
Jesus Tapia is an 54 y.o. male.   Chief Complaint: Patient is here for colonoscopy. HPI: Patient is 54 year old African-American male who is here for screening colonoscopy.  He denies abdominal pain change in bowel habits or rectal bleeding.  This is patient's first exam. Family history is negative for CRC.  Past Medical History:  Diagnosis Date  . Anxiety   . Hypertension   . OSA on CPAP    "might use CPAP once/month" (02/14/2015)        Obesity  Past Surgical History:  Procedure Laterality Date  . HERNIA REPAIR     UMBILICAL   . JOINT REPLACEMENT    . KNEE ARTHROSCOPY Right   . TOTAL HIP ARTHROPLASTY Left 02/14/2015   anterior approach.  . TOTAL HIP ARTHROPLASTY Left 02/14/2015   Procedure: TOTAL HIP ARTHROPLASTY ANTERIOR APPROACH;  Surgeon: Rod Can, MD;  Location: [place];  Service: Orthopedics;  Laterality: Left;  . UMBILICAL HERNIA REPAIR  early 2000's?    History reviewed. No pertinent family history. Social History:  reports that he has never smoked. He has never used smokeless tobacco. He reports that he does not drink alcohol or use drugs.  Allergies: No Known Allergies  Medications Prior to Admission  Medication Sig Dispense Refill  . amLODipine (NORVASC) 10 MG tablet Take 10 mg by mouth daily.    Marland Kitchen PARoxetine (PAXIL) 20 MG tablet Take 20 mg by mouth daily.    Marland Kitchen aspirin EC 325 MG tablet Take 1 tablet (325 mg total) by mouth 2 (two) times daily after a meal. 60 tablet 0  . docusate sodium (COLACE) 100 MG capsule Take 1 capsule (100 mg total) by mouth 2 (two) times daily. 60 capsule 3  . HYDROcodone-acetaminophen (NORCO) 5-325 MG tablet Take 1-2 tablets by mouth every 4 (four) hours as needed for moderate pain. 90 tablet 0  . meloxicam (MOBIC) 15 MG tablet Take 1 tablet (15 mg total) by mouth daily. 30 tablet 3  . methocarbamol (ROBAXIN) 500 MG tablet Take 1 tablet (500 mg total) by mouth every 6 (six) hours as needed for muscle spasms. 20 tablet 0  .  ondansetron (ZOFRAN) 4 MG tablet Take 1 tablet (4 mg total) by mouth every 6 (six) hours as needed for nausea. 20 tablet 0  . senna (SENOKOT) 8.6 MG TABS tablet Take 2 tablets (17.2 mg total) by mouth at bedtime. 22 each 3    Results for orders placed or performed during the hospital encounter of 08/23/19 (from the past 48 hour(s))  SARS CORONAVIRUS 2 (TAT 6-24 HRS) Nasopharyngeal Nasopharyngeal Swab     Status: None   Collection Time: 08/23/19  1:31 PM   Specimen: Nasopharyngeal Swab  Result Value Ref Range   SARS Coronavirus 2 NEGATIVE NEGATIVE    Comment: (NOTE) SARS-CoV-2 target nucleic acids are NOT DETECTED. The SARS-CoV-2 RNA is generally detectable in upper and lower respiratory specimens during the acute phase of infection. Negative results do not preclude SARS-CoV-2 infection, do not rule out co-infections with other pathogens, and should not be used as the sole basis for treatment or other patient management decisions. Negative results must be combined with clinical observations, patient history, and epidemiological information. The expected result is Negative. Fact Sheet for Patients: SugarRoll.be Fact Sheet for Healthcare Providers: https://www.woods-mathews.com/ This test is not yet approved or cleared by the [place] FDA and  has been authorized for detection and/or diagnosis of SARS-CoV-2 by FDA under an Emergency Use Authorization (EUA). This EUA will  remain  in effect (meaning this test can be used) for the duration of the COVID-19 declaration under Section 56 4(b)(1) of the Act, 21 U.S.C. section 360bbb-3(b)(1), unless the authorization is terminated or revoked sooner. Performed at [facility], [place] [address] [place], [place] [address]    No results found.  Review of Systems  Blood pressure (!) 149/94, pulse 89, temperature 98.2 F (36.8 C), temperature source Oral, resp. rate 19, SpO2 96 %. Physical Exam   Constitutional: He appears well-developed and well-nourished.  HENT:  Mouth/Throat: Oropharynx is clear and moist.  Eyes: Conjunctivae are normal. No scleral icterus.  Neck: No thyromegaly present.  Cardiovascular: Normal rate, regular rhythm and normal heart sounds.  No murmur heard. Respiratory: Effort normal and breath sounds normal.  GI:  Abdomen is obese.  He has horizontal supraumbilical scar.  On palpation abdomen is soft and nontender with organomegaly or masses.  Musculoskeletal:        General: Edema present.     Comments: Nonpitting pretibial edema.  Lymphadenopathy:    He has no cervical adenopathy.  Neurological: He is alert.  Skin: Skin is warm and dry.     Assessment/Plan Average risk screening colonoscopy.  Hildred Laser, MD 08/25/2019, 8:25 AM

## 2019-08-25 NOTE — Discharge Instructions (Addendum)
No aspirin or NSAIDs for 24 hours. Resume usual medications and diet as before. No driving for 24 hours. Physician will call with biopsy results.   Monitored Anesthesia Care, Care After These instructions provide you with information about caring for yourself after your procedure. Your health care provider may also give you more specific instructions. Your treatment has been planned according to current medical practices, but problems sometimes occur. Call your health care provider if you have any problems or questions after your procedure. What can I expect after the procedure? After your procedure, you may:  Feel sleepy for several hours.  Feel clumsy and have poor balance for several hours.  Feel forgetful about what happened after the procedure.  Have poor judgment for several hours.  Feel nauseous or vomit.  Have a sore throat if you had a breathing tube during the procedure. Follow these instructions at home: For at least 24 hours after the procedure:      Have a responsible adult stay with you. It is important to have someone help care for you until you are awake and alert.  Rest as needed.  Do not: ? Participate in activities in which you could fall or become injured. ? Drive. ? Use heavy machinery. ? Drink alcohol. ? Take sleeping pills or medicines that cause drowsiness. ? Make important decisions or sign legal documents. ? Take care of children on your own. Eating and drinking  Follow the diet that is recommended by your health care provider.  If you vomit, drink water, juice, or soup when you can drink without vomiting.  Make sure you have little or no nausea before eating solid foods. General instructions  Take over-the-counter and prescription medicines only as told by your health care provider.  If you have sleep apnea, surgery and certain medicines can increase your risk for breathing problems. Follow instructions from your health care provider about  wearing your sleep device: ? Anytime you are sleeping, including during daytime naps. ? While taking prescription pain medicines, sleeping medicines, or medicines that make you drowsy.  If you smoke, do not smoke without supervision.  Keep all follow-up visits as told by your health care provider. This is important. Contact a health care provider if:  You keep feeling nauseous or you keep vomiting.  You feel light-headed.  You develop a rash.  You have a fever. Get help right away if:  You have trouble breathing. Summary  For several hours after your procedure, you may feel sleepy and have poor judgment.  Have a responsible adult stay with you for at least 24 hours or until you are awake and alert. This information is not intended to replace advice given to you by your health care provider. Make sure you discuss any questions you have with your health care provider. Document Revised: 06/21/2017 Document Reviewed: 07/14/2015 Elsevier Patient Education  [employer].  Colonoscopy, Adult, Care After This sheet gives you information about how to care for yourself after your procedure. Your health care provider may also give you more specific instructions. If you have problems or questions, contact your health care provider. What can I expect after the procedure? After the procedure, it is common to have:  A small amount of blood in your stool for 24 hours after the procedure.  Some gas.  Mild cramping or bloating of your abdomen. Follow these instructions at home: Eating and drinking   Drink enough fluid to keep your urine pale yellow.  Follow instructions from  your health care provider about eating or drinking restrictions.  Resume your normal diet as instructed by your health care provider. Avoid heavy or fried foods that are hard to digest. Activity  Rest as told by your health care provider.  Avoid sitting for a long time without moving. Get up to take short  walks every 1-2 hours. This is important to improve blood flow and breathing. Ask for help if you feel weak or unsteady.  Return to your normal activities as told by your health care provider. Ask your health care provider what activities are safe for you. Managing cramping and bloating   Try walking around when you have cramps or feel bloated.  Apply heat to your abdomen as told by your health care provider. Use the heat source that your health care provider recommends, such as a moist heat pack or a heating pad. ? Place a towel between your skin and the heat source. ? Leave the heat on for 20-30 minutes. ? Remove the heat if your skin turns bright red. This is especially important if you are unable to feel pain, heat, or cold. You may have a greater risk of getting burned. General instructions  For the first 24 hours after the procedure: ? Do not drive or use machinery. ? Do not sign important documents. ? Do not drink alcohol. ? Do your regular daily activities at a slower pace than normal. ? Eat soft foods that are easy to digest.  Take over-the-counter and prescription medicines only as told by your health care provider.  Keep all follow-up visits as told by your health care provider. This is important. Contact a health care provider if:  You have blood in your stool 2-3 days after the procedure. Get help right away if you have:  More than a small spotting of blood in your stool.  Large blood clots in your stool.  Swelling of your abdomen.  Nausea or vomiting.  A fever.  Increasing pain in your abdomen that is not relieved with medicine. Summary  After the procedure, it is common to have a small amount of blood in your stool. You may also have mild cramping and bloating of your abdomen.  For the first 24 hours after the procedure, do not drive or use machinery, sign important documents, or drink alcohol.  Get help right away if you have a lot of blood in your stool,  nausea or vomiting, a fever, or increased pain in your abdomen. This information is not intended to replace advice given to you by your health care provider. Make sure you discuss any questions you have with your health care provider. Document Revised: 10/17/2018 Document Reviewed: 10/17/2018 Elsevier Patient Education  [employer].

## 2019-08-25 NOTE — Transfer of Care (Signed)
Immediate Anesthesia Transfer of Care Note  Patient: Jesus Tapia  Procedure(s) Performed: COLONOSCOPY WITH PROPOFOL (N/A ) POLYPECTOMY  Patient Location: PACU  Anesthesia Type:General  Level of Consciousness: awake, alert , oriented and patient cooperative  Airway & Oxygen Therapy: Patient Spontanous Breathing  Post-op Assessment: Report given to RN and Post -op Vital signs reviewed and stable  Post vital signs: Reviewed and stable  Last Vitals:  Vitals Value Taken Time  BP    Temp    Pulse 76 08/25/19 0924  Resp    SpO2 82 % 08/25/19 0924  Vitals shown include unvalidated device data.  Last Pain:  Vitals:   08/25/19 0830  TempSrc:   PainSc: 0-No pain      Patients Stated Pain Goal: 8 ([phone] [id number])  Complications: No apparent anesthesia complications

## 2019-08-25 NOTE — Anesthesia Preprocedure Evaluation (Signed)
Anesthesia Evaluation  Patient identified by MRN, date of birth, ID band Patient awake    Reviewed: Allergy & Precautions, H&P , NPO status , Patient's Chart, lab work & pertinent test results, reviewed documented beta blocker date and time   Airway Mallampati: II  TM Distance: >3 FB Neck ROM: full    Dental no notable dental hx.    Pulmonary sleep apnea ,    Pulmonary exam normal breath sounds clear to auscultation       Cardiovascular Exercise Tolerance: Good hypertension, negative cardio ROS   Rhythm:regular Rate:Normal     Neuro/Psych PSYCHIATRIC DISORDERS Anxiety negative neurological ROS     GI/Hepatic negative GI ROS, Neg liver ROS,   Endo/Other  negative endocrine ROS  Renal/GU negative Renal ROS  negative genitourinary   Musculoskeletal   Abdominal   Peds  Hematology negative hematology ROS (+)   Anesthesia Other Findings   Reproductive/Obstetrics negative OB ROS                             Anesthesia Physical Anesthesia Plan  ASA: II  Anesthesia Plan: General   Post-op Pain Management:    Induction:   PONV Risk Score and Plan:   Airway Management Planned:   Additional Equipment:   Intra-op Plan:   Post-operative Plan:   Informed Consent: I have reviewed the patients History and Physical, chart, labs and discussed the procedure including the risks, benefits and alternatives for the proposed anesthesia with the patient or authorized representative who has indicated his/her understanding and acceptance.     Dental Advisory Given  Plan Discussed with: CRNA  Anesthesia Plan Comments:         Anesthesia Quick Evaluation

## 2019-08-25 NOTE — Anesthesia Postprocedure Evaluation (Signed)
Anesthesia Post Note  Patient: Jesus Tapia  Procedure(s) Performed: COLONOSCOPY WITH PROPOFOL (N/A ) POLYPECTOMY  Patient location during evaluation: PACU Anesthesia Type: General Level of consciousness: awake, oriented, awake and alert and patient cooperative Pain management: pain level controlled Vital Signs Assessment: post-procedure vital signs reviewed and stable Respiratory status: spontaneous breathing, respiratory function stable and nonlabored ventilation Cardiovascular status: stable Postop Assessment: no apparent nausea or vomiting Anesthetic complications: no     Last Vitals:  Vitals:   08/25/19 0745  BP: (!) 149/94  Pulse: 89  Resp: 19  Temp: 36.8 C  SpO2: 96%    Last Pain:  Vitals:   08/25/19 0830  TempSrc:   PainSc: 0-No pain                 GREGORY,SUZANNE

## 2019-08-25 NOTE — Op Note (Signed)
[facility] Patient Name: Jesus Tapia Procedure Date: 08/25/2019 9:18 AM MRN: [id number] Date of Birth: [birth year] Attending MD: Hildred Laser , MD CSN: [id number] Age: 54 Admit Type: Outpatient Procedure:                Colonoscopy Indications:              Screening for colorectal malignant neoplasm Providers:                Hildred Laser, MD, Otis Peak B. Sharon Seller, RN, Raphael Gibney Tech., Technician Referring MD:             Tildon Husky, MD Medicines:                Propofol per Anesthesia Complications:            No immediate complications. Estimated Blood Loss:     Estimated blood loss was minimal. Procedure:                Pre-Anesthesia Assessment:                           - Prior to the procedure, a History and Physical                            was performed, and patient medications and                            allergies were reviewed. The patient's tolerance of                            previous anesthesia was also reviewed. The risks                            and benefits of the procedure and the sedation                            options and risks were discussed with the patient.                            All questions were answered, and informed consent                            was obtained. Prior Anticoagulants: The patient has                            taken no previous anticoagulant or antiplatelet                            agents. ASA Grade Assessment: II - A patient with                            mild systemic disease. After reviewing the risks  and benefits, the patient was deemed in                            satisfactory condition to undergo the procedure.                           After obtaining informed consent, the colonoscope                            was passed under direct vision. Throughout the                            procedure, the patient's blood pressure, pulse, and                             oxygen saturations were monitored continuously. The                            PCF-[id number]) scope was introduced through                            the anus and advanced to the the cecum, identified                            by appendiceal orifice and ileocecal valve. The                            colonoscopy was technically difficult and complex                            due to inadequate bowel prep. The patient tolerated                            the procedure well. The quality of the bowel                            preparation was fair. The ileocecal valve,                            appendiceal orifice, and rectum were photographed. Scope In: Scope Out: Findings:      The perianal and digital rectal examinations were normal.      A small polyp was found in the proximal transverse colon. The polyp was       sessile. The polyp was removed with a cold snare. Resection and       retrieval were complete. The pathology specimen was placed into Bottle       Number 1.      A small polyp was found in the sigmoid colon. Biopsies were taken with a       cold forceps for histology. The pathology specimen was placed into       Bottle Number 1.      External hemorrhoids were found during retroflexion. The hemorrhoids       were small.      Anal papilla(e) were hypertrophied. Impression:               -  Preparation of the colon was fair.                           - One small polyp in the proximal transverse colon,                            removed with a cold snare. Resected and retrieved.                           - One small polyp in the sigmoid colon. Biopsied.                           - External hemorrhoids.                           - Anal papilla(e) were hypertrophied. Moderate Sedation:      Per Anesthesia Care Recommendation:           - Patient has a contact number available for                            emergencies. The signs and symptoms of  potential                            delayed complications were discussed with the                            patient. Return to normal activities tomorrow.                            Written discharge instructions were provided to the                            patient.                           - Resume previous diet today.                           - Continue present medications.                           - No aspirin, ibuprofen, naproxen, or other                            non-steroidal anti-inflammatory drugs for 1 day.                           - Await pathology results.                           - Repeat colonoscopy is recommended. The                            colonoscopy date will be determined after pathology  results from today's exam become available for                            review. Procedure Code(s):        --- Professional ---                           [phone], Colonoscopy, flexible; with removal of                            tumor(s), polyp(s), or other lesion(s) by snare                            technique                           45380, 106, Colonoscopy, flexible; with biopsy,                            single or multiple Diagnosis Code(s):        --- Professional ---                           Z12.11, Encounter for screening for malignant                            neoplasm of colon                           K64.4, Residual hemorrhoidal skin tags                           K63.5, Polyp of colon                           K62.89, Other specified diseases of anus and rectum CPT copyright 2019 American Medical Association. All rights reserved. The codes documented in this report are preliminary and upon coder review may  be revised to meet current compliance requirements. Hildred Laser, MD Hildred Laser, MD 08/25/2019 9:35:35 AM This report has been signed electronically. Number of Addenda: 0

## 2019-08-28 LAB — SURGICAL PATHOLOGY

## 2021-09-10 ENCOUNTER — Ambulatory Visit: Payer: PRIVATE HEALTH INSURANCE | Admitting: Family Medicine

## 2021-09-17 ENCOUNTER — Ambulatory Visit: Payer: PRIVATE HEALTH INSURANCE | Admitting: Family Medicine

## 2021-09-23 ENCOUNTER — Encounter: Payer: Self-pay | Admitting: Family Medicine

## 2021-09-23 ENCOUNTER — Ambulatory Visit (INDEPENDENT_AMBULATORY_CARE_PROVIDER_SITE_OTHER): Payer: 59 | Admitting: Family Medicine

## 2021-09-23 VITALS — BP 126/76 | HR 85 | Ht 73.0 in | Wt 328.8 lb

## 2021-09-23 DIAGNOSIS — E559 Vitamin D deficiency, unspecified: Secondary | ICD-10-CM

## 2021-09-23 DIAGNOSIS — R7301 Impaired fasting glucose: Secondary | ICD-10-CM

## 2021-09-23 DIAGNOSIS — Z0001 Encounter for general adult medical examination with abnormal findings: Secondary | ICD-10-CM

## 2021-09-23 DIAGNOSIS — Z23 Encounter for immunization: Secondary | ICD-10-CM

## 2021-09-23 DIAGNOSIS — Z1159 Encounter for screening for other viral diseases: Secondary | ICD-10-CM | POA: Diagnosis not present

## 2021-09-23 DIAGNOSIS — F419 Anxiety disorder, unspecified: Secondary | ICD-10-CM | POA: Diagnosis not present

## 2021-09-23 NOTE — Patient Instructions (Addendum)
I appreciate the opportunity to provide care to you today!    Follow up:  4 months  Labs: please stop by the lab today to get your blood drawn (CBC, CMP, TSH, Lipid profile, HgA1c, Vit D)  Screening: Hep C  Thank  you for getting your Tdap vaccine today    Please continue to a heart-healthy diet and increase your physical activities. Try to exercise for 27mns at least three times a week.      It was a pleasure to see you and I look forward to continuing to work together on your health and well-being. Please do not hesitate to call the office if you need care or have questions about your care.   Have a wonderful day and week. With Gratitude, GAlvira MondayMSN, FNP-BC
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
## 2021-09-24 ENCOUNTER — Other Ambulatory Visit: Payer: Self-pay | Admitting: Family Medicine

## 2021-09-24 LAB — CBC WITH DIFFERENTIAL/PLATELET
Basophils Absolute: 0.1 10*3/uL (ref 0.0–0.2)
Basos: 1 %
EOS (ABSOLUTE): 0.1 10*3/uL (ref 0.0–0.4)
Eos: 1 %
Hematocrit: 44.3 % (ref 37.5–51.0)
Hemoglobin: 15.2 g/dL (ref 13.0–17.7)
Immature Grans (Abs): 0 10*3/uL (ref 0.0–0.1)
Immature Granulocytes: 0 %
Lymphocytes Absolute: 1.8 10*3/uL (ref 0.7–3.1)
Lymphs: 21 %
MCH: 27.2 pg (ref 26.6–33.0)
MCHC: 34.3 g/dL (ref 31.5–35.7)
MCV: 79 fL (ref 79–97)
Monocytes Absolute: 0.6 10*3/uL (ref 0.1–0.9)
Monocytes: 7 %
Neutrophils Absolute: 5.8 10*3/uL (ref 1.4–7.0)
Neutrophils: 70 %
Platelets: 431 10*3/uL (ref 150–450)
RBC: 5.58 x10E6/uL (ref 4.14–5.80)
RDW: 12.9 % (ref 11.6–15.4)
WBC: 8.3 10*3/uL (ref 3.4–10.8)

## 2021-09-24 LAB — VITAMIN D 25 HYDROXY (VIT D DEFICIENCY, FRACTURES): Vit D, 25-Hydroxy: 14.1 ng/mL — ABNORMAL LOW (ref 30.0–100.0)

## 2021-09-24 LAB — CMP14+EGFR
ALT: 9 IU/L (ref 0–44)
AST: 11 IU/L (ref 0–40)
Albumin/Globulin Ratio: 1.4 (ref 1.2–2.2)
Albumin: 4.5 g/dL (ref 3.8–4.9)
Alkaline Phosphatase: 81 IU/L (ref 44–121)
BUN/Creatinine Ratio: 10 (ref 9–20)
BUN: 12 mg/dL (ref 6–24)
Bilirubin Total: 0.4 mg/dL (ref 0.0–1.2)
CO2: 24 mmol/L (ref 20–29)
Calcium: 9.3 mg/dL (ref 8.7–10.2)
Chloride: 101 mmol/L (ref 96–106)
Creatinine, Ser: 1.15 mg/dL (ref 0.76–1.27)
Globulin, Total: 3.2 g/dL (ref 1.5–4.5)
Glucose: 112 mg/dL — ABNORMAL HIGH (ref 70–99)
Potassium: 3.9 mmol/L (ref 3.5–5.2)
Sodium: 142 mmol/L (ref 134–144)
Total Protein: 7.7 g/dL (ref 6.0–8.5)
eGFR: 74 mL/min/{1.73_m2} (ref >59)

## 2021-09-24 LAB — HEMOGLOBIN A1C
Est. average glucose Bld gHb Est-mCnc: 137 mg/dL
Hgb A1c MFr Bld: 6.4 % — ABNORMAL HIGH (ref 4.8–5.6)

## 2021-09-24 LAB — LIPID PANEL
Chol/HDL Ratio: 4.7 ratio (ref 0.0–5.0)
Cholesterol, Total: 192 mg/dL (ref 100–199)
HDL: 41 mg/dL (ref >39)
LDL Chol Calc (NIH): 137 mg/dL — ABNORMAL HIGH (ref 0–99)
Triglycerides: 75 mg/dL (ref 0–149)
VLDL Cholesterol Cal: 14 mg/dL (ref 5–40)

## 2021-09-24 LAB — TSH+FREE T4
Free T4: 0.94 ng/dL (ref 0.82–1.77)
TSH: 1.28 u[IU]/mL (ref 0.450–4.500)

## 2021-09-24 LAB — HEPATITIS C ANTIBODY: Hep C Virus Ab: NONREACTIVE

## 2021-09-24 MED ORDER — VITAMIN D (ERGOCALCIFEROL) 1.25 MG (50000 UNIT) PO CAPS: 50000.0000 [IU] | ORAL_CAPSULE | ORAL | 1 refills | Status: DC

## 2021-09-24 MED ORDER — METFORMIN HCL ER 500 MG PO TB24: 1000.0000 mg | ORAL_TABLET | Freq: Every day | ORAL | 1 refills | Status: DC

## 2021-09-24 NOTE — Progress Notes (Signed)
Please inform the patient that I've sent a metformin refill and recommend that he continue treatment. He has diabetes. His Vit D level is low; therefore, I've sent a prescription for a once-weekly supplement to his pharmacy.   I recommend low carbs, fat and exercising for 30 mins  X3 daily.

## 2021-09-25 ENCOUNTER — Telehealth: Payer: Self-pay | Admitting: Family Medicine

## 2021-09-25 NOTE — Telephone Encounter (Signed)
Pt informed of lab results. 

## 2021-09-25 NOTE — Telephone Encounter (Signed)
Pt states he is returning a call  

## 2021-09-30 ENCOUNTER — Other Ambulatory Visit: Payer: Self-pay

## 2021-09-30 ENCOUNTER — Telehealth: Payer: Self-pay

## 2021-09-30 DIAGNOSIS — R7301 Impaired fasting glucose: Secondary | ICD-10-CM

## 2021-09-30 DIAGNOSIS — E559 Vitamin D deficiency, unspecified: Secondary | ICD-10-CM

## 2021-09-30 MED ORDER — METFORMIN HCL ER 500 MG PO TB24: 1000.0000 mg | ORAL_TABLET | Freq: Every day | ORAL | 1 refills | Status: DC

## 2021-09-30 MED ORDER — VITAMIN D (ERGOCALCIFEROL) 1.25 MG (50000 UNIT) PO CAPS: 50000.0000 [IU] | ORAL_CAPSULE | ORAL | 1 refills | Status: DC

## 2021-09-30 NOTE — Telephone Encounter (Signed)
Patient called said the pharmacy has not yet received medicines.   metFORMIN (GLUCOPHAGE-XR) 500 MG 24 hr tablet  Vitamin D, Ergocalciferol, (DRISDOL) 1.25 MG (50000 UNIT) CAPS capsule   Pharmacy: [facility]

## 2021-11-01 ENCOUNTER — Other Ambulatory Visit: Payer: Self-pay | Admitting: Family Medicine

## 2021-11-01 DIAGNOSIS — E559 Vitamin D deficiency, unspecified: Secondary | ICD-10-CM

## 2021-11-20 ENCOUNTER — Ambulatory Visit: Payer: PRIVATE HEALTH INSURANCE | Admitting: Family Medicine

## 2021-11-24 ENCOUNTER — Ambulatory Visit (INDEPENDENT_AMBULATORY_CARE_PROVIDER_SITE_OTHER): Payer: 59 | Admitting: Family Medicine

## 2021-11-24 ENCOUNTER — Encounter: Payer: Self-pay | Admitting: Family Medicine

## 2021-11-24 VITALS — BP 162/90 | HR 98 | Ht 74.0 in | Wt 328.1 lb

## 2021-11-24 DIAGNOSIS — F419 Anxiety disorder, unspecified: Secondary | ICD-10-CM | POA: Diagnosis not present

## 2021-11-24 DIAGNOSIS — N029 Recurrent and persistent hematuria with unspecified morphologic changes: Secondary | ICD-10-CM | POA: Diagnosis not present

## 2021-11-24 DIAGNOSIS — H9313 Tinnitus, bilateral: Secondary | ICD-10-CM | POA: Insufficient documentation

## 2021-11-24 DIAGNOSIS — R69 Illness, unspecified: Secondary | ICD-10-CM | POA: Diagnosis not present

## 2021-11-24 DIAGNOSIS — R319 Hematuria, unspecified: Secondary | ICD-10-CM | POA: Insufficient documentation

## 2021-11-24 NOTE — Patient Instructions (Signed)
I appreciate the opportunity to provide care to you today!    Follow up:  in October as scheduled   Please taper off Zoloft  over 2-4 weeks    Please continue to a heart-healthy diet and increase your physical activities. Try to exercise for 63mns at least three times a week.      It was a pleasure to see you and I look forward to continuing to work together on your health and well-being. Please do not hesitate to call the office if you need care or have questions about your care.   Have a wonderful day and week. With Gratitude, GAlvira MondayMSN, FNP-BC

## 2021-11-24 NOTE — Assessment & Plan Note (Addendum)
Transient Given that symptoms have subsided, we will continue to monitor Encouraged pt to notify me of continuous hematuria

## 2021-11-24 NOTE — Assessment & Plan Note (Signed)
Pt is taking zoloft and Buspar for anxiety Inform pt to taper off Zoloft for 2-4 weeks and only take Buspar afterwards

## 2021-11-24 NOTE — Assessment & Plan Note (Addendum)
Onset of symptoms was 2 weeks ago C/o of continuous bilateral ringing in the ears Denies vertigo, ear infection, syncope, ear pain, and drainage Informed pt that Zoloft can lead to tinnitus, but this is a rare side effect Informed pt to taper Zoloft over 2-4 weeks

## 2021-11-24 NOTE — Progress Notes (Signed)
Established Patient Office Visit  Subjective:  Patient ID: Jesus Tapia, male    DOB: [birth year]  Age: 56 y.o. MRN: [id number]  CC:  Chief Complaint  Patient presents with   Medication Problem    Pt states when he started taking vitamin d he noticed blood in his urine about a month ago has went away.    Tinnitus    Pt noticed ringing in his ears for 2 weeks now.     HPI Jesus Tapia is a 56 y.o. male with past medical history of anxiety and osteoarthritis of left hip presents for f/u of  chronic medical conditions. Transient Hematuria: c/o of blood in his urine about a month ago that subsided. He denies urinary symptoms such as urgency, frequency, and dysuria.  Tinnitus: onset of symptom was 2 weeks ago. C/o of continuous bilateral ringing in the ears. Denies vertigo, ear infection, syncope, ear pain, and drainage.  Elevated BP: No hx of HTN. Reports eating a sausage biscuit before his visit today. Denise headaches, dizziness and blurred vision.  Past Medical History:  Diagnosis Date   Anxiety    Hypertension    OSA on CPAP    "might use CPAP once/month" (02/14/2015)    Past Surgical History:  Procedure Laterality Date   COLONOSCOPY WITH PROPOFOL N/A 08/25/2019   Procedure: COLONOSCOPY WITH PROPOFOL;  Surgeon: Rogene Houston, MD;  [facility];  Service: Endoscopy;  Laterality: N/A;  825   HERNIA REPAIR     UMBILICAL    JOINT REPLACEMENT     KNEE ARTHROSCOPY Right    POLYPECTOMY  08/25/2019   Procedure: POLYPECTOMY;  Surgeon: Rogene Houston, MD;  [facility]: [facility];  Service: Endoscopy;;   TOTAL HIP ARTHROPLASTY Left 02/14/2015   anterior approach.   TOTAL HIP ARTHROPLASTY Left 02/14/2015   Procedure: TOTAL HIP ARTHROPLASTY ANTERIOR APPROACH;  Surgeon: Rod Can, MD;  Location: [place];  Service: Orthopedics;  Laterality: Left;   UMBILICAL HERNIA REPAIR  early 2000's?    History reviewed. No pertinent family history.  Social History    Socioeconomic History   Marital status: Married    Spouse name: Not on file   Number of children: Not on file   Years of education: Not on file   Highest education level: Not on file  Occupational History   Not on file  Tobacco Use   Smoking status: Never   Smokeless tobacco: Never  Substance and Sexual Activity   Alcohol use: No   Drug use: No   Sexual activity: Yes  Other Topics Concern   Not on file  Social History Narrative   Not on file   Social Determinants of Health   Financial Resource Strain: Not on file  Food Insecurity: Not on file  Transportation Needs: Not on file  Physical Activity: Not on file  Stress: Not on file  Social Connections: Not on file  Intimate Partner Violence: Not on file    Outpatient Medications Prior to Visit  Medication Sig Dispense Refill   amLODipine (NORVASC) 10 MG tablet Take 10 mg by mouth daily.     amLODipine (NORVASC) 5 MG tablet Take 5 mg by mouth daily.     busPIRone (BUSPAR) 10 MG tablet Take 10 mg by mouth 3 (three) times daily.     metFORMIN (GLUCOPHAGE-XR) 500 MG 24 hr tablet Take 2 tablets (1,000 mg total) by mouth daily. 60 tablet 1   Vitamin D, Ergocalciferol, (DRISDOL) 1.25 MG (  50000 UNIT) CAPS capsule TAKE ONE CAPSULE BY MOUTH EVERY 7 DAYS 5 capsule 1   methocarbamol (ROBAXIN) 500 MG tablet Take 1 tablet (500 mg total) by mouth every 6 (six) hours as needed for muscle spasms. 20 tablet 0   sertraline (ZOLOFT) 25 MG tablet Take 25 mg by mouth daily.     ondansetron (ZOFRAN) 4 MG tablet Take 1 tablet (4 mg total) by mouth every 6 (six) hours as needed for nausea. (Patient not taking: Reported on 09/23/2021) 20 tablet 0   PARoxetine (PAXIL) 20 MG tablet Take 20 mg by mouth daily. (Patient not taking: Reported on 09/23/2021)     senna (SENOKOT) 8.6 MG TABS tablet Take 2 tablets (17.2 mg total) by mouth at bedtime. (Patient not taking: Reported on 09/23/2021) 60 each 3   No facility-administered medications prior to visit.     No Known Allergies  ROS Review of Systems  HENT:  Positive for tinnitus.   Eyes:  Negative for visual disturbance.  Endocrine: Negative for polydipsia, polyphagia and polyuria.  Genitourinary:  Positive for hematuria.  Neurological:  Negative for dizziness and headaches.  Psychiatric/Behavioral:  Negative for self-injury and suicidal ideas.       Objective:    Physical Exam HENT:     Head: Normocephalic.     Right Ear: There is no impacted cerumen.     Left Ear: There is no impacted cerumen.  Cardiovascular:     Rate and Rhythm: Normal rate and regular rhythm.     Pulses: Normal pulses.     Heart sounds: Normal heart sounds.  Pulmonary:     Effort: Pulmonary effort is normal.     Breath sounds: Normal breath sounds.  Abdominal:     Palpations: Abdomen is soft.  Neurological:     Mental Status: He is alert.     BP (!) 162/90   Pulse 98   Ht _0  (1.88 m)   Wt (!) 328 lb 1.3 oz (148.8 kg)   SpO2 96%   BMI 42.12 kg/m  Wt Readings from Last 3 Encounters:  11/24/21 (!) 328 lb 1.3 oz (148.8 kg)  09/23/21 (!) 328 lb 12.8 oz (149.1 kg)  02/14/15 (!) 305 lb 4.8 oz (138.5 kg)    Lab Results  Component Value Date   TSH 1.280 09/23/2021   Lab Results  Component Value Date   WBC 8.3 09/23/2021   HGB 15.2 09/23/2021   HCT 44.3 09/23/2021   MCV 79 09/23/2021   PLT 431 09/23/2021   Lab Results  Component Value Date   NA 142 09/23/2021   K 3.9 09/23/2021   CO2 24 09/23/2021   GLUCOSE 112 (H) 09/23/2021   BUN 12 09/23/2021   CREATININE 1.15 09/23/2021   BILITOT 0.4 09/23/2021   ALKPHOS 81 09/23/2021   AST 11 09/23/2021   ALT 9 09/23/2021   PROT 7.7 09/23/2021   ALBUMIN 4.5 09/23/2021   CALCIUM 9.3 09/23/2021   ANIONGAP 8 02/15/2015   EGFR 74 09/23/2021   Lab Results  Component Value Date   CHOL 192 09/23/2021   Lab Results  Component Value Date   HDL 41 09/23/2021   Lab Results  Component Value Date   LDLCALC 137 (H) 09/23/2021   Lab  Results  Component Value Date   TRIG 75 09/23/2021   Lab Results  Component Value Date   CHOLHDL 4.7 09/23/2021   Lab Results  Component Value Date   HGBA1C 6.4 (H) 09/23/2021  Assessment & Plan:   Problem List Items Addressed This Visit       Other   Anxiety    Pt is taking zoloft and Buspar for anxiety Inform pt to taper off Zoloft for 2-4 weeks and only take Buspar afterwards      Hematuria - Primary    Transient Given that symptoms have subsided, we will continue to monitor Encouraged pt to notify me of continuous hematuria      Tinnitus, bilateral    Onset of symptoms was 2 weeks ago C/o of continuous bilateral ringing in the ears Denies vertigo, ear infection, syncope, ear pain, and drainage Informed pt that Zoloft can lead to tinnitus, but this is a rare side effect Informed pt to taper Zoloft over 2-4 weeks       No orders of the defined types were placed in this encounter.   Follow-up: Return if symptoms worsen or fail to improve.    Alvira Monday, FNP

## 2022-01-23 ENCOUNTER — Ambulatory Visit (INDEPENDENT_AMBULATORY_CARE_PROVIDER_SITE_OTHER): Payer: 59 | Admitting: Family Medicine

## 2022-01-23 ENCOUNTER — Encounter: Payer: Self-pay | Admitting: Family Medicine

## 2022-01-23 VITALS — BP 170/100 | HR 72 | Resp 16 | Ht 74.0 in | Wt 325.8 lb

## 2022-01-23 DIAGNOSIS — R7301 Impaired fasting glucose: Secondary | ICD-10-CM | POA: Diagnosis not present

## 2022-01-23 DIAGNOSIS — F320 Major depressive disorder, single episode, mild: Secondary | ICD-10-CM | POA: Diagnosis not present

## 2022-01-23 DIAGNOSIS — I10 Essential (primary) hypertension: Secondary | ICD-10-CM | POA: Insufficient documentation

## 2022-01-23 DIAGNOSIS — E559 Vitamin D deficiency, unspecified: Secondary | ICD-10-CM | POA: Diagnosis not present

## 2022-01-23 DIAGNOSIS — F419 Anxiety disorder, unspecified: Secondary | ICD-10-CM

## 2022-01-23 DIAGNOSIS — R69 Illness, unspecified: Secondary | ICD-10-CM | POA: Diagnosis not present

## 2022-01-23 MED ORDER — AMLODIPINE-OLMESARTAN 10-40 MG PO TABS: 1.0000 | ORAL_TABLET | Freq: Every day | ORAL | 3 refills | Status: DC

## 2022-01-23 MED ORDER — METFORMIN HCL ER 500 MG PO TB24: 1000.0000 mg | ORAL_TABLET | Freq: Every day | ORAL | 1 refills | Status: DC

## 2022-01-23 MED ORDER — VITAMIN D (ERGOCALCIFEROL) 1.25 MG (50000 UNIT) PO CAPS: ORAL_CAPSULE | ORAL | 3 refills | Status: DC

## 2022-01-23 MED ORDER — SERTRALINE HCL 25 MG PO TABS: 25.0000 mg | ORAL_TABLET | Freq: Every day | ORAL | 1 refills | Status: DC

## 2022-01-23 MED ORDER — BUSPIRONE HCL 10 MG PO TABS: 10.0000 mg | ORAL_TABLET | Freq: Three times a day (TID) | ORAL | 1 refills | Status: DC

## 2022-01-23 NOTE — Progress Notes (Signed)
Established Patient Office Visit  Subjective:  Patient ID: Jesus Tapia, male    DOB: 1966-02-25  Age: 56 y.o. MRN: [id number]  CC:  Chief Complaint  Patient presents with   Hypertension    HPI Jesus Tapia is a 56 y.o. male with past medical history of hypertension, anxiety, hematuria presents for f/u of  chronic medical conditions.  Hypertension: He has not been taking his Norvasc 10 mg daily.  He reports that he has only been taking Norvasc 5 mg daily.  He reports that he has been  under immense stress while caring for his 35 year old mother, working, and trying to care for himself.  He denies headaches, dizziness, blurry vision, and chest pain.  Anxiety: He takes BuSpar 10 mg 3 times daily.  He denies suicidal ideation and homicidal ideation. He also takes Zoloft 25 mg daily for depression.   Past Medical History:  Diagnosis Date   Anxiety    Hypertension    OSA on CPAP    "might use CPAP once/month" (02/14/2015)    Past Surgical History:  Procedure Laterality Date   COLONOSCOPY WITH PROPOFOL N/A 08/25/2019   Procedure: COLONOSCOPY WITH PROPOFOL;  Surgeon: Rogene Houston, MD;  [facility];  Service: Endoscopy;  Laterality: N/A;  825   HERNIA REPAIR     UMBILICAL    JOINT REPLACEMENT     KNEE ARTHROSCOPY Right    POLYPECTOMY  08/25/2019   Procedure: POLYPECTOMY;  Surgeon: Rogene Houston, MD;  [facility];  Service: Endoscopy;;   TOTAL HIP ARTHROPLASTY Left 02/14/2015   anterior approach.   TOTAL HIP ARTHROPLASTY Left 02/14/2015   Procedure: TOTAL HIP ARTHROPLASTY ANTERIOR APPROACH;  Surgeon: Rod Can, MD;  Location: [place];  Service: Orthopedics;  Laterality: Left;   UMBILICAL HERNIA REPAIR  early 2000's?    No family history on file.  Social History   Socioeconomic History   Marital status: Married    Spouse name: Not on file   Number of children: Not on file   Years of education: Not on file   Highest education level:  Not on file  Occupational History   Not on file  Tobacco Use   Smoking status: Never   Smokeless tobacco: Never  Substance and Sexual Activity   Alcohol use: No   Drug use: No   Sexual activity: Yes  Other Topics Concern   Not on file  Social History Narrative   Not on file   Social Determinants of Health   Financial Resource Strain: Not on file  Food Insecurity: Not on file  Transportation Needs: Not on file  Physical Activity: Not on file  Stress: Not on file  Social Connections: Not on file  Intimate Partner Violence: Not on file    Outpatient Medications Prior to Visit  Medication Sig Dispense Refill   amLODipine (NORVASC) 5 MG tablet Take 5 mg by mouth daily.     busPIRone (BUSPAR) 10 MG tablet Take 10 mg by mouth 3 (three) times daily.     metFORMIN (GLUCOPHAGE-XR) 500 MG 24 hr tablet Take 2 tablets (1,000 mg total) by mouth daily. 60 tablet 1   Vitamin D, Ergocalciferol, (DRISDOL) 1.25 MG (50000 UNIT) CAPS capsule TAKE ONE CAPSULE BY MOUTH EVERY 7 DAYS 5 capsule 1   amLODipine (NORVASC) 10 MG tablet Take 10 mg by mouth daily. (Patient not taking: Reported on 01/23/2022)     sertraline (ZOLOFT) 25 MG tablet Take 25 mg  by mouth daily.     No facility-administered medications prior to visit.    No Known Allergies  ROS Review of Systems  Constitutional:  Negative for diaphoresis and fever.  Eyes:  Negative for photophobia and visual disturbance.  Respiratory:  Negative for chest tightness and shortness of breath.   Cardiovascular:  Negative for chest pain and palpitations.  Neurological:  Negative for dizziness and headaches.      Objective:    Physical Exam HENT:     Head: Normocephalic.     Right Ear: External ear normal.     Left Ear: External ear normal.  Cardiovascular:     Rate and Rhythm: Normal rate and regular rhythm.     Pulses: Normal pulses.     Heart sounds: Normal heart sounds.  Pulmonary:     Effort: Pulmonary effort is normal.      Breath sounds: Normal breath sounds.  Neurological:     Mental Status: He is alert.     BP (!) 170/100   Pulse 72   Resp 16   Ht 6' 2"  (1.88 m)   Wt (!) 325 lb 12.8 oz (147.8 kg)   SpO2 96%   BMI 41.83 kg/m  Wt Readings from Last 3 Encounters:  01/23/22 (!) 325 lb 12.8 oz (147.8 kg)  11/24/21 (!) 328 lb 1.3 oz (148.8 kg)  09/23/21 (!) 328 lb 12.8 oz (149.1 kg)    Lab Results  Component Value Date   TSH 1.280 09/23/2021   Lab Results  Component Value Date   WBC 8.3 09/23/2021   HGB 15.2 09/23/2021   HCT 44.3 09/23/2021   MCV 79 09/23/2021   PLT 431 09/23/2021   Lab Results  Component Value Date   NA 142 09/23/2021   K 3.9 09/23/2021   CO2 24 09/23/2021   GLUCOSE 112 (H) 09/23/2021   BUN 12 09/23/2021   CREATININE 1.15 09/23/2021   BILITOT 0.4 09/23/2021   ALKPHOS 81 09/23/2021   AST 11 09/23/2021   ALT 9 09/23/2021   PROT 7.7 09/23/2021   ALBUMIN 4.5 09/23/2021   CALCIUM 9.3 09/23/2021   ANIONGAP 8 02/15/2015   EGFR 74 09/23/2021   Lab Results  Component Value Date   CHOL 192 09/23/2021   Lab Results  Component Value Date   HDL 41 09/23/2021   Lab Results  Component Value Date   LDLCALC 137 (H) 09/23/2021   Lab Results  Component Value Date   TRIG 75 09/23/2021   Lab Results  Component Value Date   CHOLHDL 4.7 09/23/2021   Lab Results  Component Value Date   HGBA1C 6.4 (H) 09/23/2021      Assessment & Plan:   Problem List Items Addressed This Visit       Cardiovascular and Mediastinum   Essential hypertension - Primary    Uncontrolled He reports that he has been taking amlodipine 5 mg and was not able to pick up amlodipine 10 mg at the pharmacy Will discontinue amlodipine 5 mg and amlodipine 10 mg Will start the patient on a combination medication with olmesartan-amlodipine 40-10 mg Encouraged the patient to start new regimen today We will follow-up on blood pressure in 4 weeks BP Readings from Last 3 Encounters:  01/23/22 (!)  170/100  11/24/21 (!) 162/90  09/23/21 126/76         Relevant Medications   amLODipine-olmesartan (AZOR) 10-40 MG tablet     Other   Anxiety    Refilled BuSpar 10 mg  3 times daily Also refilled Zoloft 25 mg daily for depression He denies suicidal ideation and homicidal ideation Encouraged to continue treatment regimen      Relevant Medications   busPIRone (BUSPAR) 10 MG tablet   sertraline (ZOLOFT) 25 MG tablet   Other Visit Diagnoses     IFG (impaired fasting glucose)       Relevant Medications   metFORMIN (GLUCOPHAGE-XR) 500 MG 24 hr tablet   Current mild episode of major depressive disorder without prior episode (HCC)       Relevant Medications   busPIRone (BUSPAR) 10 MG tablet   sertraline (ZOLOFT) 25 MG tablet   Vitamin D deficiency       Relevant Medications   Vitamin D, Ergocalciferol, (DRISDOL) 1.25 MG (50000 UNIT) CAPS capsule       Meds ordered this encounter  Medications   amLODipine-olmesartan (AZOR) 10-40 MG tablet    Sig: Take 1 tablet by mouth daily.    Dispense:  90 tablet    Refill:  3   busPIRone (BUSPAR) 10 MG tablet    Sig: Take 1 tablet (10 mg total) by mouth 3 (three) times daily.    Dispense:  90 tablet    Refill:  1   metFORMIN (GLUCOPHAGE-XR) 500 MG 24 hr tablet    Sig: Take 2 tablets (1,000 mg total) by mouth daily.    Dispense:  90 tablet    Refill:  1   sertraline (ZOLOFT) 25 MG tablet    Sig: Take 1 tablet (25 mg total) by mouth daily.    Dispense:  90 tablet    Refill:  1   Vitamin D, Ergocalciferol, (DRISDOL) 1.25 MG (50000 UNIT) CAPS capsule    Sig: TAKE ONE CAPSULE BY MOUTH EVERY 7 DAYS    Dispense:  4 capsule    Refill:  3    This prescription was filled on 10/14/2021. Any refills authorized will be placed on file.    Follow-up: Return in about 1 month (around 02/23/2022) for BP.    Alvira Monday, FNP

## 2022-01-23 NOTE — Patient Instructions (Signed)
I appreciate the opportunity to provide care to you today!    Follow up:  4 weeks  Please pick up your medications at the pharmacy   Please continue to a heart-healthy diet and increase your physical activities. Try to exercise for 25mns at least three times a week.      It was a pleasure to see you and I look forward to continuing to work together on your health and well-being. Please do not hesitate to call the office if you need care or have questions about your care.   Have a wonderful day and week. With Gratitude, GAlvira MondayMSN, FNP-BC

## 2022-01-23 NOTE — Assessment & Plan Note (Signed)
Uncontrolled He reports that he has been taking amlodipine 5 mg and was not able to pick up amlodipine 10 mg at the pharmacy Will discontinue amlodipine 5 mg and amlodipine 10 mg Will start the patient on a combination medication with olmesartan-amlodipine 40-10 mg Encouraged the patient to start new regimen today We will follow-up on blood pressure in 4 weeks BP Readings from Last 3 Encounters:  01/23/22 (!) 170/100  11/24/21 (!) 162/90  09/23/21 126/76

## 2022-01-23 NOTE — Assessment & Plan Note (Signed)
Refilled BuSpar 10 mg 3 times daily Also refilled Zoloft 25 mg daily for depression He denies suicidal ideation and homicidal ideation Encouraged to continue treatment regimen

## 2022-02-20 ENCOUNTER — Encounter: Payer: Self-pay | Admitting: Family Medicine

## 2022-02-20 ENCOUNTER — Ambulatory Visit (INDEPENDENT_AMBULATORY_CARE_PROVIDER_SITE_OTHER): Payer: 59 | Admitting: Family Medicine

## 2022-02-20 VITALS — BP 132/84 | HR 85 | Ht 74.0 in | Wt 329.1 lb

## 2022-02-20 DIAGNOSIS — I10 Essential (primary) hypertension: Secondary | ICD-10-CM | POA: Diagnosis not present

## 2022-02-20 DIAGNOSIS — R7301 Impaired fasting glucose: Secondary | ICD-10-CM

## 2022-02-20 DIAGNOSIS — E7849 Other hyperlipidemia: Secondary | ICD-10-CM

## 2022-02-20 DIAGNOSIS — H9313 Tinnitus, bilateral: Secondary | ICD-10-CM

## 2022-02-20 DIAGNOSIS — E038 Other specified hypothyroidism: Secondary | ICD-10-CM

## 2022-02-20 DIAGNOSIS — E559 Vitamin D deficiency, unspecified: Secondary | ICD-10-CM | POA: Diagnosis not present

## 2022-02-20 NOTE — Progress Notes (Signed)
Established Patient Office Visit  Subjective:  Patient ID: Jesus Tapia, male    DOB: [birth year]  Age: 56 y.o. MRN: [id number]  CC:  Chief Complaint  Patient presents with   Hypertension    4 week f/u for blood pressure check, reports taking medication consistently.    Tinnitus    Pt reports ringing in left ear for 2 months.     HPI Jesus Tapia is a 56 y.o. male with past medical history of hypertension, anxiety, hematuria presents for f/u of  chronic medical conditions.  Hypertension: He takes amlodipine-olmesartan 40-10 mg.  He denies headaches, dizziness, blurred vision and chest pain.  He reports compliance with treatment regimen.  Tendinitis: He reports ringing in his ears bacterially, but it is more pronounce in his left ear.onset of symptoms 2 months ago. Denies vertigo, ear infection, syncope, ear pain, and drainage  the left ear for 2 months.  He reports researching Cortexi hearing support drops and is interested in starting medication for treatment of tinnitus of his ears.   Past Medical History:  Diagnosis Date   Anxiety    Hypertension    OSA on CPAP    "might use CPAP once/month" (02/14/2015)    Past Surgical History:  Procedure Laterality Date   COLONOSCOPY WITH PROPOFOL N/A 08/25/2019   Procedure: COLONOSCOPY WITH PROPOFOL;  Surgeon: Rogene Houston, MD;  [facility];  Service: Endoscopy;  Laterality: N/A;  825   HERNIA REPAIR     UMBILICAL    JOINT REPLACEMENT     KNEE ARTHROSCOPY Right    POLYPECTOMY  08/25/2019   Procedure: POLYPECTOMY;  Surgeon: Rogene Houston, MD;  [facility];  Service: Endoscopy;;   TOTAL HIP ARTHROPLASTY Left 02/14/2015   anterior approach.   TOTAL HIP ARTHROPLASTY Left 02/14/2015   Procedure: TOTAL HIP ARTHROPLASTY ANTERIOR APPROACH;  Surgeon: Rod Can, MD;  Location: [place];  Service: Orthopedics;  Laterality: Left;   UMBILICAL HERNIA REPAIR  early 2000's?    History reviewed. No pertinent  family history.  Social History   Socioeconomic History   Marital status: Married    Spouse name: Not on file   Number of children: Not on file   Years of education: Not on file   Highest education level: Not on file  Occupational History   Not on file  Tobacco Use   Smoking status: Never   Smokeless tobacco: Never  Substance and Sexual Activity   Alcohol use: No   Drug use: No   Sexual activity: Yes  Other Topics Concern   Not on file  Social History Narrative   Not on file   Social Determinants of Health   Financial Resource Strain: Not on file  Food Insecurity: Not on file  Transportation Needs: Not on file  Physical Activity: Not on file  Stress: Not on file  Social Connections: Not on file  Intimate Partner Violence: Not on file    Outpatient Medications Prior to Visit  Medication Sig Dispense Refill   amLODipine-olmesartan (AZOR) 10-40 MG tablet Take 1 tablet by mouth daily. 90 tablet 3   busPIRone (BUSPAR) 10 MG tablet Take 1 tablet (10 mg total) by mouth 3 (three) times daily. 90 tablet 1   metFORMIN (GLUCOPHAGE-XR) 500 MG 24 hr tablet Take 2 tablets (1,000 mg total) by mouth daily. 90 tablet 1   sertraline (ZOLOFT) 25 MG tablet Take 1 tablet (25 mg total) by mouth daily. 90 tablet 1  Vitamin D, Ergocalciferol, (DRISDOL) 1.25 MG (50000 UNIT) CAPS capsule TAKE ONE CAPSULE BY MOUTH EVERY 7 DAYS 4 capsule 3   No facility-administered medications prior to visit.    No Known Allergies  ROS Review of Systems  Constitutional:  Negative for fatigue and fever.  HENT:  Positive for tinnitus.   Eyes:  Negative for visual disturbance.  Respiratory:  Negative for chest tightness and shortness of breath.   Neurological:  Negative for dizziness and headaches.      Objective:    Physical Exam HENT:     Head: Normocephalic.  Cardiovascular:     Rate and Rhythm: Normal rate and regular rhythm.     Pulses: Normal pulses.     Heart sounds: Normal heart sounds.   Pulmonary:     Effort: Pulmonary effort is normal.     Breath sounds: Normal breath sounds. No wheezing.  Musculoskeletal:     Cervical back: No rigidity.  Neurological:     Mental Status: He is alert.     BP 132/84   Pulse 85   Ht _0  (1.88 m)   Wt (!) 329 lb 1.3 oz (149.3 kg)   SpO2 97%   BMI 42.25 kg/m  Wt Readings from Last 3 Encounters:  02/20/22 (!) 329 lb 1.3 oz (149.3 kg)  01/23/22 (!) 325 lb 12.8 oz (147.8 kg)  11/24/21 (!) 328 lb 1.3 oz (148.8 kg)    Lab Results  Component Value Date   TSH 1.280 09/23/2021   Lab Results  Component Value Date   WBC 8.3 09/23/2021   HGB 15.2 09/23/2021   HCT 44.3 09/23/2021   MCV 79 09/23/2021   PLT 431 09/23/2021   Lab Results  Component Value Date   NA 142 09/23/2021   K 3.9 09/23/2021   CO2 24 09/23/2021   GLUCOSE 112 (H) 09/23/2021   BUN 12 09/23/2021   CREATININE 1.15 09/23/2021   BILITOT 0.4 09/23/2021   ALKPHOS 81 09/23/2021   AST 11 09/23/2021   ALT 9 09/23/2021   PROT 7.7 09/23/2021   ALBUMIN 4.5 09/23/2021   CALCIUM 9.3 09/23/2021   ANIONGAP 8 02/15/2015   EGFR 74 09/23/2021   Lab Results  Component Value Date   CHOL 192 09/23/2021   Lab Results  Component Value Date   HDL 41 09/23/2021   Lab Results  Component Value Date   LDLCALC 137 (H) 09/23/2021   Lab Results  Component Value Date   TRIG 75 09/23/2021   Lab Results  Component Value Date   CHOLHDL 4.7 09/23/2021   Lab Results  Component Value Date   HGBA1C 6.4 (H) 09/23/2021      Assessment & Plan:  Essential hypertension Assessment & Plan: Controlled No changes to treatment regimen today Encouraged to continue taking olmesartan-amlodipine 40-2m BP Readings from Last 3 Encounters:  02/20/22 132/84  01/23/22 (!) 170/100  11/24/21 (!) 162/90     Orders: -     CMP14+EGFR -     CBC with Differential/Platelet  Tinnitus, bilateral Assessment & Plan: Referral placed to ENT for further evaluation of tendinitis of the  ears bilaterally Unfamiliar with the supplement Cortexi Hearing support Drops Informed patient that I will do further research and get in contact with him regarding my medical opinion on the supplement  Orders: -     Ambulatory referral to ENT  Vitamin D deficiency -     VITAMIN D 25 Hydroxy (Vit-D Deficiency, Fractures)  IFG (impaired fasting glucose) -  Hemoglobin A1c  Other specified hypothyroidism -     TSH + free T4  Other hyperlipidemia -     Lipid panel    Follow-up: Return in about 3 months (around 05/23/2022).   Alvira Monday, FNP

## 2022-02-20 NOTE — Patient Instructions (Signed)
I appreciate the opportunity to provide care to you today!    Follow up:  3 months  Labs: please stop by the lab today to get your blood drawn (CBC, CMP, TSH, Lipid profile, HgA1c, Vit D)    Please continue to a heart-healthy diet and increase your physical activities. Try to exercise for 70mns at least three times a week.      It was a pleasure to see you and I look forward to continuing to work together on your health and well-being. Please do not hesitate to call the office if you need care or have questions about your care.   Have a wonderful day and week. With Gratitude, GAlvira MondayMSN, FNP-BC

## 2022-02-20 NOTE — Assessment & Plan Note (Signed)
Controlled No changes to treatment regimen today Encouraged to continue taking olmesartan-amlodipine 40-'10mg'$  BP Readings from Last 3 Encounters:  02/20/22 132/84  01/23/22 (!) 170/100  11/24/21 (!) 162/90

## 2022-02-20 NOTE — Assessment & Plan Note (Signed)
Referral placed to ENT for further evaluation of tendinitis of the ears bilaterally Unfamiliar with the supplement Cortexi Hearing support Drops Informed patient that I will do further research and get in contact with him regarding my medical opinion on the supplement

## 2022-02-21 LAB — LIPID PANEL
Chol/HDL Ratio: 4.3 ratio (ref 0.0–5.0)
Cholesterol, Total: 183 mg/dL (ref 100–199)
HDL: 43 mg/dL (ref >39)
LDL Chol Calc (NIH): 124 mg/dL — ABNORMAL HIGH (ref 0–99)
Triglycerides: 86 mg/dL (ref 0–149)
VLDL Cholesterol Cal: 16 mg/dL (ref 5–40)

## 2022-02-21 LAB — CMP14+EGFR
ALT: 7 IU/L (ref 0–44)
AST: 12 IU/L (ref 0–40)
Albumin/Globulin Ratio: 1.3 (ref 1.2–2.2)
Albumin: 4.1 g/dL (ref 3.8–4.9)
Alkaline Phosphatase: 73 IU/L (ref 44–121)
BUN/Creatinine Ratio: 11 (ref 9–20)
BUN: 12 mg/dL (ref 6–24)
Bilirubin Total: 0.4 mg/dL (ref 0.0–1.2)
CO2: 26 mmol/L (ref 20–29)
Calcium: 9.4 mg/dL (ref 8.7–10.2)
Chloride: 101 mmol/L (ref 96–106)
Creatinine, Ser: 1.09 mg/dL (ref 0.76–1.27)
Globulin, Total: 3.1 g/dL (ref 1.5–4.5)
Glucose: 97 mg/dL (ref 70–99)
Potassium: 3.8 mmol/L (ref 3.5–5.2)
Sodium: 142 mmol/L (ref 134–144)
Total Protein: 7.2 g/dL (ref 6.0–8.5)
eGFR: 79 mL/min/{1.73_m2} (ref >59)

## 2022-02-21 LAB — CBC WITH DIFFERENTIAL/PLATELET
Basophils Absolute: 0.1 10*3/uL (ref 0.0–0.2)
Basos: 1 %
EOS (ABSOLUTE): 0.1 10*3/uL (ref 0.0–0.4)
Eos: 1 %
Hematocrit: 44.4 % (ref 37.5–51.0)
Hemoglobin: 14.6 g/dL (ref 13.0–17.7)
Immature Grans (Abs): 0 10*3/uL (ref 0.0–0.1)
Immature Granulocytes: 0 %
Lymphocytes Absolute: 2 10*3/uL (ref 0.7–3.1)
Lymphs: 26 %
MCH: 26.6 pg (ref 26.6–33.0)
MCHC: 32.9 g/dL (ref 31.5–35.7)
MCV: 81 fL (ref 79–97)
Monocytes Absolute: 0.6 10*3/uL (ref 0.1–0.9)
Monocytes: 7 %
Neutrophils Absolute: 5.1 10*3/uL (ref 1.4–7.0)
Neutrophils: 65 %
Platelets: 429 10*3/uL (ref 150–450)
RBC: 5.49 x10E6/uL (ref 4.14–5.80)
RDW: 14 % (ref 11.6–15.4)
WBC: 7.8 10*3/uL (ref 3.4–10.8)

## 2022-02-21 LAB — HEMOGLOBIN A1C
Est. average glucose Bld gHb Est-mCnc: 137 mg/dL
Hgb A1c MFr Bld: 6.4 % — ABNORMAL HIGH (ref 4.8–5.6)

## 2022-02-21 LAB — TSH+FREE T4
Free T4: 0.91 ng/dL (ref 0.82–1.77)
TSH: 1.44 u[IU]/mL (ref 0.450–4.500)

## 2022-02-21 LAB — VITAMIN D 25 HYDROXY (VIT D DEFICIENCY, FRACTURES): Vit D, 25-Hydroxy: 30.4 ng/mL (ref 30.0–100.0)

## 2022-02-24 ENCOUNTER — Other Ambulatory Visit: Payer: Self-pay | Admitting: Family Medicine

## 2022-03-01 ENCOUNTER — Other Ambulatory Visit: Payer: Self-pay | Admitting: Family Medicine

## 2022-03-01 DIAGNOSIS — E7849 Other hyperlipidemia: Secondary | ICD-10-CM

## 2022-03-01 MED ORDER — ROSUVASTATIN CALCIUM 10 MG PO TABS: 10.0000 mg | ORAL_TABLET | Freq: Every day | ORAL | 3 refills | Status: DC

## 2022-03-01 NOTE — Progress Notes (Signed)
Please encourage the patient to continue taking metformin at 1000 mg daily.  A prescription for rosuvastatin 10 mg daily has been sent to her pharmacy because her cholesterol is elevated.  I recommend low-carb fat with increased physical activity.  I also recommend decreasing her intake of high-sugar foods.  All other labs are stable.

## 2022-03-02 NOTE — Progress Notes (Signed)
Pt returning call..thc

## 2022-03-18 ENCOUNTER — Telehealth: Payer: Self-pay | Admitting: Family Medicine

## 2022-03-18 ENCOUNTER — Other Ambulatory Visit: Payer: Self-pay | Admitting: Family Medicine

## 2022-03-18 DIAGNOSIS — N528 Other male erectile dysfunction: Secondary | ICD-10-CM

## 2022-03-18 NOTE — Telephone Encounter (Signed)
Pt would like closer referral in town [facility]) also asking for a refill on cialis (generic), says he asked for it at last visit and it wasn't sent in.

## 2022-03-18 NOTE — Telephone Encounter (Signed)
Patient called in requesting call back. Wants has a few questions, also states that he was waiting on a call back in regard to ringing in ear.

## 2022-03-18 NOTE — Telephone Encounter (Signed)
Left message with referral information

## 2022-03-22 ENCOUNTER — Other Ambulatory Visit: Payer: Self-pay | Admitting: Family Medicine

## 2022-03-22 DIAGNOSIS — F419 Anxiety disorder, unspecified: Secondary | ICD-10-CM

## 2022-03-25 ENCOUNTER — Other Ambulatory Visit: Payer: Self-pay | Admitting: Family Medicine

## 2022-03-25 DIAGNOSIS — N528 Other male erectile dysfunction: Secondary | ICD-10-CM

## 2022-03-25 MED ORDER — TADALAFIL 20 MG PO TABS: 10.0000 mg | ORAL_TABLET | ORAL | 2 refills | Status: DC | PRN

## 2022-03-25 NOTE — Telephone Encounter (Signed)
Pt states cost is not a problem for him he would like referral sent locally.

## 2022-03-25 NOTE — Telephone Encounter (Signed)
Kindly place a referral to Dr. Tamsen Roers, ENT

## 2022-03-25 NOTE — Telephone Encounter (Signed)
Cialis prescription sent.  Please inform the patient that the ENT in [place] does not accept his insurance.  I recommend that he follow up with the referral placed

## 2022-04-20 ENCOUNTER — Other Ambulatory Visit: Payer: Self-pay | Admitting: Family Medicine

## 2022-04-20 DIAGNOSIS — R7301 Impaired fasting glucose: Secondary | ICD-10-CM

## 2022-05-14 ENCOUNTER — Other Ambulatory Visit: Payer: Self-pay | Admitting: Family Medicine

## 2022-05-14 DIAGNOSIS — F419 Anxiety disorder, unspecified: Secondary | ICD-10-CM

## 2022-05-29 ENCOUNTER — Ambulatory Visit (INDEPENDENT_AMBULATORY_CARE_PROVIDER_SITE_OTHER): Payer: 59 | Admitting: Family Medicine

## 2022-05-29 ENCOUNTER — Encounter: Payer: Self-pay | Admitting: Family Medicine

## 2022-05-29 VITALS — BP 158/90 | HR 95 | Ht 74.0 in | Wt 320.0 lb

## 2022-05-29 DIAGNOSIS — F419 Anxiety disorder, unspecified: Secondary | ICD-10-CM

## 2022-05-29 DIAGNOSIS — E1165 Type 2 diabetes mellitus with hyperglycemia: Secondary | ICD-10-CM

## 2022-05-29 DIAGNOSIS — E559 Vitamin D deficiency, unspecified: Secondary | ICD-10-CM | POA: Diagnosis not present

## 2022-05-29 DIAGNOSIS — E0789 Other specified disorders of thyroid: Secondary | ICD-10-CM | POA: Diagnosis not present

## 2022-05-29 DIAGNOSIS — I10 Essential (primary) hypertension: Secondary | ICD-10-CM | POA: Diagnosis not present

## 2022-05-29 DIAGNOSIS — E7849 Other hyperlipidemia: Secondary | ICD-10-CM

## 2022-05-29 DIAGNOSIS — R7301 Impaired fasting glucose: Secondary | ICD-10-CM

## 2022-05-29 DIAGNOSIS — R7303 Prediabetes: Secondary | ICD-10-CM

## 2022-05-29 DIAGNOSIS — F320 Major depressive disorder, single episode, mild: Secondary | ICD-10-CM

## 2022-05-29 DIAGNOSIS — R69 Illness, unspecified: Secondary | ICD-10-CM | POA: Diagnosis not present

## 2022-05-29 MED ORDER — AMLODIPINE-OLMESARTAN 10-40 MG PO TABS: 1.0000 | ORAL_TABLET | Freq: Every day | ORAL | 3 refills | Status: DC

## 2022-05-29 MED ORDER — ROSUVASTATIN CALCIUM 10 MG PO TABS: 10.0000 mg | ORAL_TABLET | Freq: Every day | ORAL | 3 refills | Status: DC

## 2022-05-29 MED ORDER — METFORMIN HCL ER 500 MG PO TB24: 1000.0000 mg | ORAL_TABLET | Freq: Every day | ORAL | 1 refills | Status: DC

## 2022-05-29 MED ORDER — SERTRALINE HCL 25 MG PO TABS: 25.0000 mg | ORAL_TABLET | Freq: Every day | ORAL | 1 refills | Status: DC

## 2022-05-29 MED ORDER — BUSPIRONE HCL 10 MG PO TABS: 10.0000 mg | ORAL_TABLET | Freq: Three times a day (TID) | ORAL | 1 refills | Status: DC

## 2022-05-29 MED ORDER — VITAMIN D (ERGOCALCIFEROL) 1.25 MG (50000 UNIT) PO CAPS: ORAL_CAPSULE | ORAL | 3 refills | Status: DC

## 2022-05-29 NOTE — Patient Instructions (Addendum)
I appreciate the opportunity to provide care to you today!    Follow up:  2 weeks BP   Labs: please stop by the lab during the week to get your blood drawn (CBC, CMP, TSH, Lipid profile, HgA1c, Vit D)  I recommend low-sodium diet with increased physical activity Please bring your blood pressure readings with you at your next appointment I want your blood pressure to be less than 140/90   Please continue to a heart-healthy diet and increase your physical activities. Try to exercise for 85mns at least five times a week.   Physical activity helps: Lower your blood glucose, improve your heart health, lower your blood pressure and cholesterol, burn calories to help manage her weight, gave you energy, lower stress, and improve his sleep.  The American diabetes Association (ADA) recommends being active for 2-1/2 hours (150 minutes) or more week.  Exercise for 30 minutes, 5 days a week (150 minutes total)    It was a pleasure to see you and I look forward to continuing to work together on your health and well-being. Please do not hesitate to call the office if you need care or have questions about your care.   Have a wonderful day and week. With Gratitude, GAlvira MondayMSN, FNP-BC

## 2022-05-29 NOTE — Progress Notes (Unsigned)
Established Patient Office Visit  Subjective:  Patient ID: Jesus Tapia, male    DOB: Dec 22, 1965  Age: 57 y.o. MRN: [id number]  CC:  Chief Complaint  Patient presents with   Follow-up    3 month f/u.     HPI Jesus Tapia is a 57 y.o. male with past medical history of hypertension, anxiety, type 2 diabetes presents for f/u of  chronic medical conditions. For the details of today's visit, please refer to the assessment and plan.     Past Medical History:  Diagnosis Date   Anxiety    Hypertension    OSA on CPAP    "might use CPAP once/month" (02/14/2015)    Past Surgical History:  Procedure Laterality Date   COLONOSCOPY WITH PROPOFOL N/A 08/25/2019   Procedure: COLONOSCOPY WITH PROPOFOL;  Surgeon: Rogene Houston, MD;  [facility];  Service: Endoscopy;  Laterality: N/A;  825   HERNIA REPAIR     UMBILICAL    JOINT REPLACEMENT     KNEE ARTHROSCOPY Right    POLYPECTOMY  08/25/2019   Procedure: POLYPECTOMY;  Surgeon: Rogene Houston, MD;  [facility]: [facility];  Service: Endoscopy;;   TOTAL HIP ARTHROPLASTY Left 02/14/2015   anterior approach.   TOTAL HIP ARTHROPLASTY Left 02/14/2015   Procedure: TOTAL HIP ARTHROPLASTY ANTERIOR APPROACH;  Surgeon: Rod Can, MD;  Location: [place];  Service: Orthopedics;  Laterality: Left;   UMBILICAL HERNIA REPAIR  early 2000's?    History reviewed. No pertinent family history.  Social History   Socioeconomic History   Marital status: Married    Spouse name: Not on file   Number of children: Not on file   Years of education: Not on file   Highest education level: Not on file  Occupational History   Not on file  Tobacco Use   Smoking status: Never   Smokeless tobacco: Never  Substance and Sexual Activity   Alcohol use: No   Drug use: No   Sexual activity: Yes  Other Topics Concern   Not on file  Social History Narrative   Not on file   Social Determinants of Health   Financial Resource Strain: Not  on file  Food Insecurity: Not on file  Transportation Needs: Not on file  Physical Activity: Not on file  Stress: Not on file  Social Connections: Not on file  Intimate Partner Violence: Not on file    Outpatient Medications Prior to Visit  Medication Sig Dispense Refill   tadalafil (CIALIS) 20 MG tablet Take 0.5-1 tablets (10-20 mg total) by mouth every other day as needed for erectile dysfunction. 10 tablet 2   amLODipine-olmesartan (AZOR) 10-40 MG tablet Take 1 tablet by mouth daily. 90 tablet 3   busPIRone (BUSPAR) 10 MG tablet TAKE 1 TABLET BY MOUTH THREE TIMES DAILY 90 tablet 1   metFORMIN (GLUCOPHAGE-XR) 500 MG 24 hr tablet TAKE 2 TABLETS BY MOUTH EVERY DAY 90 tablet 1   rosuvastatin (CRESTOR) 10 MG tablet Take 1 tablet (10 mg total) by mouth daily. 90 tablet 3   sertraline (ZOLOFT) 25 MG tablet Take 1 tablet (25 mg total) by mouth daily. 90 tablet 1   Vitamin D, Ergocalciferol, (DRISDOL) 1.25 MG (50000 UNIT) CAPS capsule TAKE ONE CAPSULE BY MOUTH EVERY 7 DAYS 4 capsule 3   No facility-administered medications prior to visit.    No Known Allergies  ROS Review of Systems  Constitutional:  Negative for fatigue and fever.  Eyes:  Negative for visual disturbance.  Respiratory:  Negative for chest tightness and shortness of breath.   Cardiovascular:  Negative for chest pain and palpitations.  Neurological:  Negative for dizziness and headaches.      Objective:    Physical Exam HENT:     Head: Normocephalic.     Right Ear: External ear normal.     Left Ear: External ear normal.     Nose: No congestion or rhinorrhea.     Mouth/Throat:     Mouth: Mucous membranes are moist.  Cardiovascular:     Rate and Rhythm: Regular rhythm.     Heart sounds: No murmur heard. Pulmonary:     Effort: No respiratory distress.     Breath sounds: Normal breath sounds.  Neurological:     Mental Status: He is alert.     BP (!) 158/90 (BP Location: Left Arm)   Pulse 95   Ht '6\' 2"'$   (1.88 m)   Wt (!) 320 lb (145.2 kg)   SpO2 95%   BMI 41.09 kg/m  Wt Readings from Last 3 Encounters:  05/29/22 (!) 320 lb (145.2 kg)  02/20/22 (!) 329 lb 1.3 oz (149.3 kg)  01/23/22 (!) 325 lb 12.8 oz (147.8 kg)    Lab Results  Component Value Date   TSH 1.440 02/20/2022   Lab Results  Component Value Date   WBC 7.8 02/20/2022   HGB 14.6 02/20/2022   HCT 44.4 02/20/2022   MCV 81 02/20/2022   PLT 429 02/20/2022   Lab Results  Component Value Date   NA 142 02/20/2022   K 3.8 02/20/2022   CO2 26 02/20/2022   GLUCOSE 97 02/20/2022   BUN 12 02/20/2022   CREATININE 1.09 02/20/2022   BILITOT 0.4 02/20/2022   ALKPHOS 73 02/20/2022   AST 12 02/20/2022   ALT 7 02/20/2022   PROT 7.2 02/20/2022   ALBUMIN 4.1 02/20/2022   CALCIUM 9.4 02/20/2022   ANIONGAP 8 02/15/2015   EGFR 79 02/20/2022   Lab Results  Component Value Date   CHOL 183 02/20/2022   Lab Results  Component Value Date   HDL 43 02/20/2022   Lab Results  Component Value Date   LDLCALC 124 (H) 02/20/2022   Lab Results  Component Value Date   TRIG 86 02/20/2022   Lab Results  Component Value Date   CHOLHDL 4.3 02/20/2022   Lab Results  Component Value Date   HGBA1C 6.4 (H) 02/20/2022      Assessment & Plan:  Essential hypertension Assessment & Plan: Uncontrolled Reports ambulatory BP in the Q000111Q systolic and 123XX123 diastolic Patient is asymptomatic Encouraged to continue taking olmesartan-amlodipine 40-'10mg'$  We will follow-up on BP in 2 weeks BP Readings from Last 3 Encounters:  05/29/22 (!) 158/90  02/20/22 132/84  01/23/22 (!) 170/100     Orders: -     CBC with Differential/Platelet -     CMP14+EGFR -     amLODIPine-Olmesartan; Take 1 tablet by mouth daily.  Dispense: 90 tablet; Refill: 3  Type 2 diabetes mellitus with hyperglycemia, without long-term current use of insulin ([place]) Assessment & Plan: He takes metformin 1000 mg daily Denies polyuria, polydipsia, and polyphagia Will  assess hemoglobin A1c today Lab Results  Component Value Date   HGBA1C 6.4 (H) 02/20/2022      Anxiety Assessment & Plan: Stable on BuSpar and Zoloft Denies suicidal ideation or thoughts   Orders: -     busPIRone HCl; Take 1 tablet (10 mg total)  by mouth 3 (three) times daily.  Dispense: 90 tablet; Refill: 1  Other hyperlipidemia -     Lipid panel -     Rosuvastatin Calcium; Take 1 tablet (10 mg total) by mouth daily.  Dispense: 90 tablet; Refill: 3  Prediabetes -     Hemoglobin A1c  Vitamin D deficiency -     VITAMIN D 25 Hydroxy (Vit-D Deficiency, Fractures) -     Vitamin D (Ergocalciferol); TAKE ONE CAPSULE BY MOUTH EVERY 7 DAYS  Dispense: 4 capsule; Refill: 3  Other specified disorders of thyroid -     TSH + free T4  IFG (impaired fasting glucose) -     metFORMIN HCl ER; Take 2 tablets (1,000 mg total) by mouth daily.  Dispense: 90 tablet; Refill: 1  Current mild episode of major depressive disorder without prior episode (HCC) -     Sertraline HCl; Take 1 tablet (25 mg total) by mouth daily.  Dispense: 90 tablet; Refill: 1    Follow-up: Return in about 2 weeks (around 06/12/2022) for BP.   Alvira Monday, FNP

## 2022-05-31 DIAGNOSIS — E1165 Type 2 diabetes mellitus with hyperglycemia: Secondary | ICD-10-CM | POA: Insufficient documentation

## 2022-05-31 NOTE — Assessment & Plan Note (Signed)
Uncontrolled Reports ambulatory BP in the Q000111Q systolic and 123XX123 diastolic Patient is asymptomatic Encouraged to continue taking olmesartan-amlodipine 40-'10mg'$  We will follow-up on BP in 2 weeks BP Readings from Last 3 Encounters:  05/29/22 (!) 158/90  02/20/22 132/84  01/23/22 (!) 170/100

## 2022-05-31 NOTE — Assessment & Plan Note (Signed)
Stable on BuSpar and Zoloft Denies suicidal ideation or thoughts

## 2022-05-31 NOTE — Assessment & Plan Note (Signed)
He takes metformin 1000 mg daily Denies polyuria, polydipsia, and polyphagia Will assess hemoglobin A1c today Lab Results  Component Value Date   HGBA1C 6.4 (H) 02/20/2022

## 2022-06-17 ENCOUNTER — Encounter: Payer: Self-pay | Admitting: Family Medicine

## 2022-06-17 ENCOUNTER — Ambulatory Visit (INDEPENDENT_AMBULATORY_CARE_PROVIDER_SITE_OTHER): Payer: 59 | Admitting: Family Medicine

## 2022-06-17 VITALS — BP 136/88 | HR 78 | Wt 319.0 lb

## 2022-06-17 DIAGNOSIS — I10 Essential (primary) hypertension: Secondary | ICD-10-CM

## 2022-06-17 NOTE — Progress Notes (Signed)
Established Patient Office Visit  Subjective:  Patient ID: Jesus Tapia, male    DOB: 02-24-1966  Age: 57 y.o. MRN: [id number]  CC:  Chief Complaint  Patient presents with   Follow-up    2 week f/u.     HPI TUG REUM is a 57 y.o. male with past medical history of essential hypertension, type 2 diabetes presents for hypertension f/u.  For the details of today's visit, please refer to the assessment and plan.    Past Medical History:  Diagnosis Date   Anxiety    Hypertension    OSA on CPAP    "might use CPAP once/month" (02/14/2015)    Past Surgical History:  Procedure Laterality Date   COLONOSCOPY WITH PROPOFOL N/A 08/25/2019   Procedure: COLONOSCOPY WITH PROPOFOL;  Surgeon: Rogene Houston, MD;  [facility];  Service: Endoscopy;  Laterality: N/A;  825   HERNIA REPAIR     UMBILICAL    JOINT REPLACEMENT     KNEE ARTHROSCOPY Right    POLYPECTOMY  08/25/2019   Procedure: POLYPECTOMY;  Surgeon: Rogene Houston, MD;  [facility];  Service: Endoscopy;;   TOTAL HIP ARTHROPLASTY Left 02/14/2015   anterior approach.   TOTAL HIP ARTHROPLASTY Left 02/14/2015   Procedure: TOTAL HIP ARTHROPLASTY ANTERIOR APPROACH;  Surgeon: Rod Can, MD;  Location: [place];  Service: Orthopedics;  Laterality: Left;   UMBILICAL HERNIA REPAIR  early 2000's?    History reviewed. No pertinent family history.  Social History   Socioeconomic History   Marital status: Married    Spouse name: Not on file   Number of children: Not on file   Years of education: Not on file   Highest education level: Not on file  Occupational History   Not on file  Tobacco Use   Smoking status: Never   Smokeless tobacco: Never  Substance and Sexual Activity   Alcohol use: No   Drug use: No   Sexual activity: Yes  Other Topics Concern   Not on file  Social History Narrative   Not on file   Social Determinants of Health   Financial Resource Strain: Not on file  Food  Insecurity: Not on file  Transportation Needs: Not on file  Physical Activity: Not on file  Stress: Not on file  Social Connections: Not on file  Intimate Partner Violence: Not on file    Outpatient Medications Prior to Visit  Medication Sig Dispense Refill   amLODipine-olmesartan (AZOR) 10-40 MG tablet Take 1 tablet by mouth daily. 90 tablet 3   busPIRone (BUSPAR) 10 MG tablet Take 1 tablet (10 mg total) by mouth 3 (three) times daily. 90 tablet 1   metFORMIN (GLUCOPHAGE-XR) 500 MG 24 hr tablet Take 2 tablets (1,000 mg total) by mouth daily. 90 tablet 1   rosuvastatin (CRESTOR) 10 MG tablet Take 1 tablet (10 mg total) by mouth daily. 90 tablet 3   sertraline (ZOLOFT) 25 MG tablet Take 1 tablet (25 mg total) by mouth daily. 90 tablet 1   tadalafil (CIALIS) 20 MG tablet Take 0.5-1 tablets (10-20 mg total) by mouth every other day as needed for erectile dysfunction. 10 tablet 2   Vitamin D, Ergocalciferol, (DRISDOL) 1.25 MG (50000 UNIT) CAPS capsule TAKE ONE CAPSULE BY MOUTH EVERY 7 DAYS 4 capsule 3   No facility-administered medications prior to visit.    No Known Allergies  ROS Review of Systems  Constitutional:  Negative for fatigue and fever.  Eyes:  Negative for visual disturbance.  Respiratory:  Negative for chest tightness and shortness of breath.   Cardiovascular:  Negative for chest pain and palpitations.  Neurological:  Negative for dizziness and headaches.      Objective:    Physical Exam HENT:     Head: Normocephalic.     Right Ear: External ear normal.     Left Ear: External ear normal.     Nose: No congestion or rhinorrhea.     Mouth/Throat:     Mouth: Mucous membranes are moist.  Cardiovascular:     Rate and Rhythm: Regular rhythm.     Heart sounds: No murmur heard. Pulmonary:     Effort: No respiratory distress.     Breath sounds: Normal breath sounds.  Neurological:     Mental Status: He is alert.     BP 136/88   Pulse 78   Wt (!) 319 lb 0.6 oz  (144.7 kg)   SpO2 95%   BMI 40.96 kg/m  Wt Readings from Last 3 Encounters:  06/17/22 (!) 319 lb 0.6 oz (144.7 kg)  05/29/22 (!) 320 lb (145.2 kg)  02/20/22 (!) 329 lb 1.3 oz (149.3 kg)    Lab Results  Component Value Date   TSH 1.440 02/20/2022   Lab Results  Component Value Date   WBC 7.8 02/20/2022   HGB 14.6 02/20/2022   HCT 44.4 02/20/2022   MCV 81 02/20/2022   PLT 429 02/20/2022   Lab Results  Component Value Date   NA 142 02/20/2022   K 3.8 02/20/2022   CO2 26 02/20/2022   GLUCOSE 97 02/20/2022   BUN 12 02/20/2022   CREATININE 1.09 02/20/2022   BILITOT 0.4 02/20/2022   ALKPHOS 73 02/20/2022   AST 12 02/20/2022   ALT 7 02/20/2022   PROT 7.2 02/20/2022   ALBUMIN 4.1 02/20/2022   CALCIUM 9.4 02/20/2022   ANIONGAP 8 02/15/2015   EGFR 79 02/20/2022   Lab Results  Component Value Date   CHOL 183 02/20/2022   Lab Results  Component Value Date   HDL 43 02/20/2022   Lab Results  Component Value Date   LDLCALC 124 (H) 02/20/2022   Lab Results  Component Value Date   TRIG 86 02/20/2022   Lab Results  Component Value Date   CHOLHDL 4.3 02/20/2022   Lab Results  Component Value Date   HGBA1C 6.4 (H) 02/20/2022      Assessment & Plan:  Essential hypertension Assessment & Plan: Controlled Reports compliance with olmesartan and amlodipine 40-10 daily Denies headaches, dizziness, blurred vision, chest pain, palpitation, shortness of breath Reports adherence with lifestyle modification including low-sodium intake and increase physical activity No refills needed today Will assess BMP BP Readings from Last 3 Encounters:  06/17/22 136/88  05/29/22 (!) 158/90  02/20/22 132/84     Orders: -     Microalbumin / creatinine urine ratio -     BMP8+EGFR    Follow-up: Return in about 3 months (around 09/17/2022).   Alvira Monday, FNP

## 2022-06-17 NOTE — Patient Instructions (Signed)
I appreciate the opportunity to provide care to you today!    Follow up:  3 months  Labs: please stop by the lab today to get your blood drawn (BMP)  Keep Up the Good Work!!!! SO Proud of YOU!   Please continue to a heart-healthy diet and increase your physical activities. Try to exercise for 40mns at least five times a week.   Physical activity helps: Lower your blood glucose, improve your heart health, lower your blood pressure and cholesterol, burn calories to help manage her weight, gave you energy, lower stress, and improve his sleep.  The American diabetes Association (ADA) recommends being active for 2-1/2 hours (150 minutes) or more week.  Exercise for 30 minutes, 5 days a week (150 minutes total)    It was a pleasure to see you and I look forward to continuing to work together on your health and well-being. Please do not hesitate to call the office if you need care or have questions about your care.   Have a wonderful day and week. With Gratitude, GAlvira MondayMSN, FNP-BC

## 2022-06-17 NOTE — Assessment & Plan Note (Signed)
Controlled Reports compliance with olmesartan and amlodipine 40-10 daily Denies headaches, dizziness, blurred vision, chest pain, palpitation, shortness of breath Reports adherence with lifestyle modification including low-sodium intake and increase physical activity No refills needed today Will assess BMP BP Readings from Last 3 Encounters:  06/17/22 136/88  05/29/22 (!) 158/90  02/20/22 132/84

## 2022-06-17 NOTE — Addendum Note (Signed)
Addended by: Bolivar Haw on: 06/17/2022 09:14 AM   Modules accepted: Orders

## 2022-06-18 LAB — BMP8+EGFR
BUN/Creatinine Ratio: 11 (ref 9–20)
BUN: 13 mg/dL (ref 6–24)
CO2: 21 mmol/L (ref 20–29)
Calcium: 9.1 mg/dL (ref 8.7–10.2)
Chloride: 105 mmol/L (ref 96–106)
Creatinine, Ser: 1.17 mg/dL (ref 0.76–1.27)
Glucose: 88 mg/dL (ref 70–99)
Potassium: 4.1 mmol/L (ref 3.5–5.2)
Sodium: 143 mmol/L (ref 134–144)
eGFR: 72 mL/min/{1.73_m2} (ref >59)

## 2022-06-20 LAB — MICROALBUMIN / CREATININE URINE RATIO
Creatinine, Urine: 234.7 mg/dL
Microalb/Creat Ratio: 23 mg/g creat (ref 0–29)
Microalbumin, Urine: 54.4 ug/mL

## 2022-08-10 DIAGNOSIS — I10 Essential (primary) hypertension: Secondary | ICD-10-CM | POA: Diagnosis not present

## 2022-08-10 DIAGNOSIS — E119 Type 2 diabetes mellitus without complications: Secondary | ICD-10-CM | POA: Diagnosis not present

## 2022-08-10 DIAGNOSIS — F419 Anxiety disorder, unspecified: Secondary | ICD-10-CM | POA: Diagnosis not present

## 2022-08-10 DIAGNOSIS — G3184 Mild cognitive impairment, so stated: Secondary | ICD-10-CM | POA: Diagnosis not present

## 2022-08-10 DIAGNOSIS — E785 Hyperlipidemia, unspecified: Secondary | ICD-10-CM | POA: Diagnosis not present

## 2022-08-10 DIAGNOSIS — Z833 Family history of diabetes mellitus: Secondary | ICD-10-CM | POA: Diagnosis not present

## 2022-08-10 DIAGNOSIS — Z8249 Family history of ischemic heart disease and other diseases of the circulatory system: Secondary | ICD-10-CM | POA: Diagnosis not present

## 2022-08-10 DIAGNOSIS — E559 Vitamin D deficiency, unspecified: Secondary | ICD-10-CM | POA: Diagnosis not present

## 2022-08-10 DIAGNOSIS — Z008 Encounter for other general examination: Secondary | ICD-10-CM | POA: Diagnosis not present

## 2022-08-10 DIAGNOSIS — Z6839 Body mass index (BMI) 39.0-39.9, adult: Secondary | ICD-10-CM | POA: Diagnosis not present

## 2022-08-10 DIAGNOSIS — Z7984 Long term (current) use of oral hypoglycemic drugs: Secondary | ICD-10-CM | POA: Diagnosis not present

## 2022-09-18 ENCOUNTER — Ambulatory Visit: Payer: 59 | Admitting: Family Medicine

## 2022-09-22 ENCOUNTER — Ambulatory Visit: Payer: 59 | Admitting: Family Medicine

## 2022-10-01 ENCOUNTER — Ambulatory Visit: Payer: 59 | Admitting: Family Medicine

## 2022-10-12 ENCOUNTER — Other Ambulatory Visit: Payer: Self-pay | Admitting: Family Medicine

## 2022-10-12 DIAGNOSIS — F419 Anxiety disorder, unspecified: Secondary | ICD-10-CM

## 2022-10-12 DIAGNOSIS — R7301 Impaired fasting glucose: Secondary | ICD-10-CM

## 2022-10-22 ENCOUNTER — Encounter: Payer: Self-pay | Admitting: Family Medicine

## 2022-10-22 ENCOUNTER — Ambulatory Visit (INDEPENDENT_AMBULATORY_CARE_PROVIDER_SITE_OTHER): Payer: 59 | Admitting: Family Medicine

## 2022-10-22 VITALS — BP 140/80 | HR 91 | Ht 74.0 in | Wt 320.0 lb

## 2022-10-22 DIAGNOSIS — N528 Other male erectile dysfunction: Secondary | ICD-10-CM

## 2022-10-22 DIAGNOSIS — R7301 Impaired fasting glucose: Secondary | ICD-10-CM

## 2022-10-22 DIAGNOSIS — E559 Vitamin D deficiency, unspecified: Secondary | ICD-10-CM | POA: Diagnosis not present

## 2022-10-22 DIAGNOSIS — H9313 Tinnitus, bilateral: Secondary | ICD-10-CM

## 2022-10-22 DIAGNOSIS — E038 Other specified hypothyroidism: Secondary | ICD-10-CM

## 2022-10-22 DIAGNOSIS — I10 Essential (primary) hypertension: Secondary | ICD-10-CM

## 2022-10-22 DIAGNOSIS — F320 Major depressive disorder, single episode, mild: Secondary | ICD-10-CM

## 2022-10-22 DIAGNOSIS — E7849 Other hyperlipidemia: Secondary | ICD-10-CM

## 2022-10-22 DIAGNOSIS — E1165 Type 2 diabetes mellitus with hyperglycemia: Secondary | ICD-10-CM

## 2022-10-22 DIAGNOSIS — F419 Anxiety disorder, unspecified: Secondary | ICD-10-CM

## 2022-10-22 MED ORDER — AMLODIPINE-OLMESARTAN 10-40 MG PO TABS: 1.0000 | ORAL_TABLET | Freq: Every day | ORAL | 3 refills | Status: DC

## 2022-10-22 MED ORDER — VITAMIN B-12 1000 MCG PO TABS
1000.0000 ug | ORAL_TABLET | Freq: Every day | ORAL | 1 refills | Status: DC
Start: 2022-10-22 — End: 2023-02-01

## 2022-10-22 MED ORDER — TADALAFIL 20 MG PO TABS
20.0000 mg | ORAL_TABLET | ORAL | 0 refills | Status: AC | PRN
Start: 2022-10-22 — End: ?

## 2022-10-22 MED ORDER — HYDROXYZINE PAMOATE 25 MG PO CAPS
25.0000 mg | ORAL_CAPSULE | Freq: Three times a day (TID) | ORAL | 2 refills | Status: DC | PRN
Start: 2022-10-22 — End: 2022-12-14

## 2022-10-22 MED ORDER — METFORMIN HCL ER 500 MG PO TB24: 1000.0000 mg | ORAL_TABLET | Freq: Every day | ORAL | 1 refills | Status: DC

## 2022-10-22 MED ORDER — SERTRALINE HCL 50 MG PO TABS: 50.0000 mg | ORAL_TABLET | Freq: Every day | ORAL | 3 refills | Status: DC

## 2022-10-22 MED ORDER — VITAMIN D (ERGOCALCIFEROL) 1.25 MG (50000 UNIT) PO CAPS
ORAL_CAPSULE | ORAL | 1 refills | Status: DC
Start: 2022-10-22 — End: 2023-08-31

## 2022-10-22 MED ORDER — ROSUVASTATIN CALCIUM 10 MG PO TABS
10.0000 mg | ORAL_TABLET | Freq: Every day | ORAL | 3 refills | Status: DC
Start: 2022-10-22 — End: 2023-09-06
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
## 2022-10-22 NOTE — Assessment & Plan Note (Signed)
Uncontrolled Reports increased life stresses noting that he is brother has cirrhosis and his daughter has a brain tumor He has been compliant on olmesartan and amlodipine 40-10 mg daily Asymptomatic in the clinic Low-sodium diet with increased physical activity encourage No changes made to treatment regimen Encouraged to check ambulatory readings at home and to inform me for consistently elevated BP greater than 140/80 BP Readings from Last 3 Encounters:  10/22/22 (!) 140/80  06/17/22 136/88  05/29/22 (!) 158/90

## 2022-10-22 NOTE — Patient Instructions (Addendum)
I appreciate the opportunity to provide care to you today!    Follow up:  4 months  Labs: please stop by the lab today to get your blood drawn (CBC, CMP, TSH, Lipid profile, HgA1c, Vit D)  Nonpharmacologic management of anxiety and depression  Mindfulness and Meditation Practices like mindfulness meditation can help reduce symptoms by promoting relaxation and present-moment awareness.  Exercise  Regular physical activity has been shown to improve mood and reduce anxiety through the release of endorphins and other neurochemicals.  Healthy Diet Eating a balanced diet rich in fruits, vegetables, whole grains, and lean proteins can support overall mental health.  Sleep Hygiene  Establishing a regular sleep routine and ensuring good sleep quality can significantly impact mood and anxiety levels.  Stress Management Techniques Activities such as yoga, tai chi, and deep breathing exercises can help manage stress.  Social Support Maintaining strong relationships and seeking support from friends, family, or support groups can provide emotional comfort and reduce feelings of isolation.  Lifestyle Modifications Reducing alcohol and caffeine intake, quitting smoking, and avoiding recreational drugs can improve symptoms.  Art and Music Therapy Engaging in creative activities like painting, drawing, or playing music can be therapeutic and help express emotions.  Light Therapy Particularly useful for seasonal affective disorder (SAD), exposure to bright light can help regulate mood. .    Tinnitis Start taking Vit b12 1000 mcg daily  Anxiety Kindly taper off Buspar and start taking hydroxyzine 25 mg every 8 hours as needed I've increased your Zoloft to 50 mg daily  Please let me know if having symptoms of tremors, excessive sweating, altered mental status and fever while on therapy as these are symptoms of serotonin syndrome    Attached with your AVS, you will find valuable resources for  self-education. I highly recommend dedicating some time to thoroughly examine them.   Please continue to a heart-healthy diet and increase your physical activities. Try to exercise for at least five days a week.    It was a pleasure to see you and I look forward to continuing to work together on your health and well-being. Please do not hesitate to call the office if you need care or have questions about your care.  In case of emergency, please visit the Emergency Department for urgent care, or contact our clinic at [phone] to schedule an appointment. We're here to help you!   Have a wonderful day and week. With Gratitude, Gilmore Laroche MSN, FNP-BC

## 2022-10-22 NOTE — Assessment & Plan Note (Signed)
He takes metformin 1000 mg daily Denies polyuria, polydipsia, and polyphagia Will assess hemoglobin A1c today Encourage decreasing his intake of high sugar foods and beverages Lab Results  Component Value Date   HGBA1C 6.4 (H) 02/20/2022

## 2022-10-22 NOTE — Assessment & Plan Note (Signed)
Did not follow-up with referral placed to ENT due to the distance Encouraged to start taking vitamin B 12,000 mcg daily for tendinitis Encouraged to inform me of worsening symptoms
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
## 2022-10-23 LAB — CMP14+EGFR
ALT: 8 IU/L (ref 0–44)
AST: 14 IU/L (ref 0–40)
Albumin: 4.5 g/dL (ref 3.8–4.9)
Alkaline Phosphatase: 73 IU/L (ref 44–121)
BUN/Creatinine Ratio: 11 (ref 9–20)
BUN: 14 mg/dL (ref 6–24)
Bilirubin Total: 0.5 mg/dL (ref 0.0–1.2)
CO2: 23 mmol/L (ref 20–29)
Calcium: 9.5 mg/dL (ref 8.7–10.2)
Chloride: 101 mmol/L (ref 96–106)
Creatinine, Ser: 1.29 mg/dL — ABNORMAL HIGH (ref 0.76–1.27)
Globulin, Total: 3 g/dL (ref 1.5–4.5)
Glucose: 92 mg/dL (ref 70–99)
Potassium: 4.9 mmol/L (ref 3.5–5.2)
Sodium: 140 mmol/L (ref 134–144)
Total Protein: 7.5 g/dL (ref 6.0–8.5)
eGFR: 64 mL/min/{1.73_m2} (ref >59)

## 2022-10-23 LAB — CBC WITH DIFFERENTIAL/PLATELET
Basophils Absolute: 0.1 10*3/uL (ref 0.0–0.2)
Basos: 1 %
EOS (ABSOLUTE): 0.1 10*3/uL (ref 0.0–0.4)
Eos: 1 %
Hematocrit: 45.6 % (ref 37.5–51.0)
Hemoglobin: 15 g/dL (ref 13.0–17.7)
Immature Grans (Abs): 0 10*3/uL (ref 0.0–0.1)
Immature Granulocytes: 0 %
Lymphocytes Absolute: 1.9 10*3/uL (ref 0.7–3.1)
Lymphs: 24 %
MCH: 27.6 pg (ref 26.6–33.0)
MCHC: 32.9 g/dL (ref 31.5–35.7)
MCV: 84 fL (ref 79–97)
Monocytes Absolute: 0.6 10*3/uL (ref 0.1–0.9)
Monocytes: 8 %
Neutrophils Absolute: 5.4 10*3/uL (ref 1.4–7.0)
Neutrophils: 66 %
Platelets: 371 10*3/uL (ref 150–450)
RBC: 5.43 x10E6/uL (ref 4.14–5.80)
RDW: 13 % (ref 11.6–15.4)
WBC: 8.1 10*3/uL (ref 3.4–10.8)

## 2022-10-23 LAB — LIPID PANEL
Chol/HDL Ratio: 2.6 ratio (ref 0.0–5.0)
Cholesterol, Total: 116 mg/dL (ref 100–199)
HDL: 45 mg/dL (ref >39)
LDL Chol Calc (NIH): 57 mg/dL (ref 0–99)
Triglycerides: 69 mg/dL (ref 0–149)
VLDL Cholesterol Cal: 14 mg/dL (ref 5–40)

## 2022-10-23 LAB — TSH+FREE T4
Free T4: 1 ng/dL (ref 0.82–1.77)
TSH: 1.63 u[IU]/mL (ref 0.450–4.500)

## 2022-10-23 LAB — HEMOGLOBIN A1C
Est. average glucose Bld gHb Est-mCnc: 137 mg/dL
Hgb A1c MFr Bld: 6.4 % — ABNORMAL HIGH (ref 4.8–5.6)

## 2022-10-23 LAB — VITAMIN D 25 HYDROXY (VIT D DEFICIENCY, FRACTURES): Vit D, 25-Hydroxy: 53.2 ng/mL (ref 30.0–100.0)

## 2022-12-10 ENCOUNTER — Other Ambulatory Visit: Payer: Self-pay | Admitting: Family Medicine

## 2022-12-10 DIAGNOSIS — F419 Anxiety disorder, unspecified: Secondary | ICD-10-CM

## 2022-12-14 ENCOUNTER — Other Ambulatory Visit: Payer: Self-pay | Admitting: Family Medicine

## 2022-12-14 DIAGNOSIS — F419 Anxiety disorder, unspecified: Secondary | ICD-10-CM

## 2022-12-22 ENCOUNTER — Telehealth: Payer: Self-pay | Admitting: Family Medicine

## 2022-12-22 NOTE — Telephone Encounter (Signed)
Prescription Request  12/22/2022  LOV: 10/22/2022  What is the name of the medication or equipment? busPIRone (BUSPAR) 10 MG tablet [[id number]]  DISCONTINUED    Have you contacted your pharmacy to request a refill? Yes   Which pharmacy would you like this sent to?  [facility] [place], [place] - [address] [place] [address] Phone: [phone] Fax: [phone]    Patient notified that their request is being sent to the clinical staff for review and that they should receive a response within 2 business days.   Please advise at Mobile [phone] (mobile)      Patient wants a call back if med cannot be refilled

## 2022-12-23 ENCOUNTER — Other Ambulatory Visit: Payer: Self-pay

## 2022-12-23 DIAGNOSIS — F419 Anxiety disorder, unspecified: Secondary | ICD-10-CM

## 2022-12-23 MED ORDER — BUSPIRONE HCL 10 MG PO TABS
10.0000 mg | ORAL_TABLET | Freq: Three times a day (TID) | ORAL | 1 refills | Status: DC
Start: 2022-12-23 — End: 2023-02-01

## 2022-12-23 NOTE — Telephone Encounter (Signed)
Refill sent.

## 2023-02-01 ENCOUNTER — Other Ambulatory Visit: Payer: Self-pay | Admitting: Family Medicine

## 2023-02-01 DIAGNOSIS — H9313 Tinnitus, bilateral: Secondary | ICD-10-CM

## 2023-02-01 DIAGNOSIS — F419 Anxiety disorder, unspecified: Secondary | ICD-10-CM

## 2023-02-22 ENCOUNTER — Ambulatory Visit (INDEPENDENT_AMBULATORY_CARE_PROVIDER_SITE_OTHER): Payer: 59 | Admitting: Family Medicine

## 2023-02-22 ENCOUNTER — Encounter: Payer: Self-pay | Admitting: Family Medicine

## 2023-02-22 VITALS — BP 138/88 | HR 90 | Ht 74.0 in | Wt 328.1 lb

## 2023-02-22 DIAGNOSIS — E7849 Other hyperlipidemia: Secondary | ICD-10-CM

## 2023-02-22 DIAGNOSIS — E559 Vitamin D deficiency, unspecified: Secondary | ICD-10-CM

## 2023-02-22 DIAGNOSIS — F419 Anxiety disorder, unspecified: Secondary | ICD-10-CM | POA: Diagnosis not present

## 2023-02-22 DIAGNOSIS — E1165 Type 2 diabetes mellitus with hyperglycemia: Secondary | ICD-10-CM | POA: Diagnosis not present

## 2023-02-22 DIAGNOSIS — I10 Essential (primary) hypertension: Secondary | ICD-10-CM

## 2023-02-22 DIAGNOSIS — Z7984 Long term (current) use of oral hypoglycemic drugs: Secondary | ICD-10-CM | POA: Diagnosis not present

## 2023-02-22 DIAGNOSIS — R7301 Impaired fasting glucose: Secondary | ICD-10-CM | POA: Diagnosis not present

## 2023-02-22 DIAGNOSIS — E038 Other specified hypothyroidism: Secondary | ICD-10-CM | POA: Diagnosis not present

## 2023-02-22 NOTE — Assessment & Plan Note (Signed)
The patient denies suicidal thoughts or ideation. They report compliance with Zoloft 50 mg daily. Reviewed nonpharmacological management strategies for anxiety, including meditation, mindfulness, deep breathing exercises, adhering to a well-balanced diet, and increasing physical activity. The patient verbalized understanding of the plan.

## 2023-02-22 NOTE — Assessment & Plan Note (Signed)
He takes metformin 1000 mg daily Denies polyuria, polydipsia, and polyphagia Will assess hemoglobin A1c today Encourage decreasing his intake of high sugar foods and beverages Lab Results  Component Value Date   HGBA1C 6.4 (H) 10/22/2022

## 2023-02-22 NOTE — Progress Notes (Signed)
Established Patient Office Visit  Subjective:  Patient ID: Jesus Tapia, male    DOB: 1965/12/07  Age: 57 y.o. MRN: [id number]  CC:  Chief Complaint  Patient presents with   Care Management    4 month f/u    HPI Jesus Tapia is a 57 y.o. male with past medical history of hypertension, hyperlipidemia, anxiety, type 2 diabetes presents for f/u of  chronic medical conditions. For the details of today's visit, please refer to the assessment and plan.     Past Medical History:  Diagnosis Date   Anxiety    Hypertension    OSA on CPAP    "might use CPAP once/month" (02/14/2015)    Past Surgical History:  Procedure Laterality Date   COLONOSCOPY WITH PROPOFOL N/A 08/25/2019   Procedure: COLONOSCOPY WITH PROPOFOL;  Surgeon: Malissa Hippo, MD;  [facility]: [facility];  Service: Endoscopy;  Laterality: N/A;  825   HERNIA REPAIR     UMBILICAL    JOINT REPLACEMENT     KNEE ARTHROSCOPY Right    POLYPECTOMY  08/25/2019   Procedure: POLYPECTOMY;  Surgeon: Malissa Hippo, MD;  [facility]: [facility];  Service: Endoscopy;;   TOTAL HIP ARTHROPLASTY Left 02/14/2015   anterior approach.   TOTAL HIP ARTHROPLASTY Left 02/14/2015   Procedure: TOTAL HIP ARTHROPLASTY ANTERIOR APPROACH;  Surgeon: Samson Frederic, MD;  Location: [facility];  Service: Orthopedics;  Laterality: Left;   UMBILICAL HERNIA REPAIR  early 2000's?    History reviewed. No pertinent family history.  Social History   Socioeconomic History   Marital status: Married    Spouse name: Not on file   Number of children: Not on file   Years of education: Not on file   Highest education level: 12th grade  Occupational History   Not on file  Tobacco Use   Smoking status: Never   Smokeless tobacco: Never  Substance and Sexual Activity   Alcohol use: No   Drug use: No   Sexual activity: Yes  Other Topics Concern   Not on file  Social History Narrative   Not on file   Social Determinants of Health   Financial  Resource Strain: Low Risk  (02/18/2023)   Overall Financial Resource Strain (CARDIA)    Difficulty of Paying Living Expenses: Not hard at all  Food Insecurity: No Food Insecurity (02/18/2023)   Hunger Vital Sign    Worried About Running Out of Food in the Last Year: Never true    Ran Out of Food in the Last Year: Never true  Transportation Needs: No Transportation Needs (02/18/2023)   PRAPARE - Administrator, Civil Service (Medical): No    Lack of Transportation (Non-Medical): No  Physical Activity: Insufficiently Active (02/18/2023)   Exercise Vital Sign    Days of Exercise per Week: 2 days    Minutes of Exercise per Session: 40 min  Stress: No Stress Concern Present (02/18/2023)   [employer] of Occupational Health - Occupational Stress Questionnaire    Feeling of Stress : Not at all  Social Connections: Moderately Integrated (02/18/2023)   Social Connection and Isolation Panel [NHANES]    Frequency of Communication with Friends and Family: More than three times a week    Frequency of Social Gatherings with Friends and Family: Twice a week    Attends Religious Services: More than 4 times per year    Active Member of [employer] or Organizations: Yes    Attends Club or  Organization Meetings: 1 to 4 times per year    Marital Status: Separated  Intimate Partner Violence: Not on file    Outpatient Medications Prior to Visit  Medication Sig Dispense Refill   amLODipine-olmesartan (AZOR) 10-40 MG tablet Take 1 tablet by mouth daily. 90 tablet 3   busPIRone (BUSPAR) 10 MG tablet TAKE 1 TABLET BY MOUTH THREE TIMES DAILY 90 tablet 1   FT VITAMIN B-12 PR 1000 MCG TBCR TAKE 1 TABLET BY MOUTH ONCE DAILY 60 tablet 1   hydrOXYzine (VISTARIL) 25 MG capsule TAKE ONE CAPSULE BY MOUTH EVERY 8 HOURS AS NEEDED 30 capsule 2   metFORMIN (GLUCOPHAGE-XR) 500 MG 24 hr tablet Take 2 tablets (1,000 mg total) by mouth daily. 90 tablet 1   rosuvastatin (CRESTOR) 10 MG tablet Take 1 tablet (10 mg  total) by mouth daily. 90 tablet 3   sertraline (ZOLOFT) 50 MG tablet TAKE 1 TABLET BY MOUTH ONCE DAILY 30 tablet 3   tadalafil (CIALIS) 20 MG tablet Take 1 tablet (20 mg total) by mouth every other day as needed for erectile dysfunction. 15 tablet 0   Vitamin D, Ergocalciferol, (DRISDOL) 1.25 MG (50000 UNIT) CAPS capsule TAKE ONE CAPSULE BY MOUTH EVERY 7 DAYS 20 capsule 1   No facility-administered medications prior to visit.    No Known Allergies  ROS Review of Systems  Constitutional:  Negative for fatigue and fever.  Eyes:  Negative for visual disturbance.  Respiratory:  Negative for chest tightness and shortness of breath.   Cardiovascular:  Negative for chest pain and palpitations.  Neurological:  Negative for dizziness and headaches.      Objective:    Physical Exam HENT:     Head: Normocephalic.     Right Ear: External ear normal.     Left Ear: External ear normal.     Nose: No congestion or rhinorrhea.     Mouth/Throat:     Mouth: Mucous membranes are moist.  Cardiovascular:     Rate and Rhythm: Regular rhythm.     Heart sounds: No murmur heard. Pulmonary:     Effort: No respiratory distress.     Breath sounds: Normal breath sounds.  Neurological:     Mental Status: He is alert.     BP 138/88 (BP Location: Left Arm)   Pulse 90   Ht 6\' 2"  (1.88 m)   Wt (!) 328 lb 1.3 oz (148.8 kg)   SpO2 93%   BMI 42.12 kg/m  Wt Readings from Last 3 Encounters:  02/22/23 (!) 328 lb 1.3 oz (148.8 kg)  10/22/22 (!) 320 lb 0.6 oz (145.2 kg)  06/17/22 (!) 319 lb 0.6 oz (144.7 kg)    Lab Results  Component Value Date   TSH 1.630 10/22/2022   Lab Results  Component Value Date   WBC 8.1 10/22/2022   HGB 15.0 10/22/2022   HCT 45.6 10/22/2022   MCV 84 10/22/2022   PLT 371 10/22/2022   Lab Results  Component Value Date   NA 140 10/22/2022   K 4.9 10/22/2022   CO2 23 10/22/2022   GLUCOSE 92 10/22/2022   BUN 14 10/22/2022   CREATININE 1.29 (H) 10/22/2022   BILITOT  0.5 10/22/2022   ALKPHOS 73 10/22/2022   AST 14 10/22/2022   ALT 8 10/22/2022   PROT 7.5 10/22/2022   ALBUMIN 4.5 10/22/2022   CALCIUM 9.5 10/22/2022   ANIONGAP 8 02/15/2015   EGFR 64 10/22/2022   Lab Results  Component Value Date  CHOL 116 10/22/2022   Lab Results  Component Value Date   HDL 45 10/22/2022   Lab Results  Component Value Date   LDLCALC 57 10/22/2022   Lab Results  Component Value Date   TRIG 69 10/22/2022   Lab Results  Component Value Date   CHOLHDL 2.6 10/22/2022   Lab Results  Component Value Date   HGBA1C 6.4 (H) 10/22/2022      Assessment & Plan:  Essential hypertension Assessment & Plan: The patient's blood pressure is controlled in the clinic today. He report compliance with Olmesartan-Amlodipine 40-10 mg daily. The patient is asymptomatic in the clinic. Encouraged a low-sodium diet and increased physical activity to help maintain optimal blood pressure. BP Readings from Last 3 Encounters:  02/22/23 138/88  10/22/22 (!) 140/80  06/17/22 136/88       Type 2 diabetes mellitus with hyperglycemia, without long-term current use of insulin (HCC) Assessment & Plan: He takes metformin 1000 mg daily Denies polyuria, polydipsia, and polyphagia Will assess hemoglobin A1c today Encourage decreasing his intake of high sugar foods and beverages Lab Results  Component Value Date   HGBA1C 6.4 (H) 10/22/2022      Anxiety Assessment & Plan: The patient denies suicidal thoughts or ideation. They report compliance with Zoloft 50 mg daily. Reviewed nonpharmacological management strategies for anxiety, including meditation, mindfulness, deep breathing exercises, adhering to a well-balanced diet, and increasing physical activity. The patient verbalized understanding of the plan.     IFG (impaired fasting glucose) -     Hemoglobin A1c  Vitamin D deficiency -     VITAMIN D 25 Hydroxy (Vit-D Deficiency, Fractures)  Other hyperlipidemia -      Lipid panel -     CMP14+EGFR -     CBC with Differential/Platelet  TSH (thyroid-stimulating hormone deficiency) -     TSH + free T4   Note: This chart has been completed using Engineer, civil (consulting) software, and while attempts have been made to ensure accuracy, certain words and phrases may not be transcribed as intended.   Follow-up: Return in about 4 months (around 06/22/2023).   Gilmore Laroche, FNP

## 2023-02-22 NOTE — Patient Instructions (Signed)
I appreciate the opportunity to provide care to you today!    Follow up:  4 months  Labs: please stop by the lab today to get your blood drawn (CBC, CMP, TSH, Lipid profile, HgA1c, Vit D)  For managing diabetes, I recommend the following lifestyle changes:  Reduce Intake of High-Sugar Foods and Beverages: Limit foods and drinks high in sugar to help regulate blood sugar levels. Increase Consumption of Nutrient-Rich Foods: Focus on incorporating more fruits, vegetables, and whole grains into your diet. Choose Lean Proteins: Opt for lean proteins such as chicken, fish, beans, and legumes. Select Low-Fat Dairy Products: Choose low-fat or non-fat dairy options. Minimize Saturated Fats, Trans Fats, and Cholesterol: Reduce intake of foods high in saturated fats, trans fatty acids, and cholesterol. Engage in Regular Physical Activity: Aim for at least 30 minutes of brisk walking or other moderate activity at least 5 days a week.   Attached with your AVS, you will find valuable resources for self-education. I highly recommend dedicating some time to thoroughly examine them.   Please continue to a heart-healthy diet and increase your physical activities. Try to exercise for at least five days a week.    It was a pleasure to see you and I look forward to continuing to work together on your health and well-being. Please do not hesitate to call the office if you need care or have questions about your care.  In case of emergency, please visit the Emergency Department for urgent care, or contact our clinic at [phone] to schedule an appointment. We're here to help you!   Have a wonderful day and week. With Gratitude, Gilmore Laroche MSN, FNP-BC

## 2023-02-22 NOTE — Assessment & Plan Note (Signed)
The patient's blood pressure is controlled in the clinic today. He report compliance with Olmesartan-Amlodipine 40-10 mg daily. The patient is asymptomatic in the clinic. Encouraged a low-sodium diet and increased physical activity to help maintain optimal blood pressure. BP Readings from Last 3 Encounters:  02/22/23 138/88  10/22/22 (!) 140/80  06/17/22 136/88

## 2023-02-24 LAB — CMP14+EGFR
ALT: 6 [IU]/L (ref 0–44)
AST: 12 [IU]/L (ref 0–40)
Albumin: 4 g/dL (ref 3.8–4.9)
Alkaline Phosphatase: 71 IU/L (ref 44–121)
BUN/Creatinine Ratio: 10 (ref 9–20)
BUN: 14 mg/dL (ref 6–24)
Bilirubin Total: 0.4 mg/dL (ref 0.0–1.2)
CO2: 20 mmol/L (ref 20–29)
Calcium: 9.5 mg/dL (ref 8.7–10.2)
Chloride: 103 mmol/L (ref 96–106)
Creatinine, Ser: 1.34 mg/dL — ABNORMAL HIGH (ref 0.76–1.27)
Globulin, Total: 3 g/dL (ref 1.5–4.5)
Glucose: 95 mg/dL (ref 70–99)
Potassium: 4.5 mmol/L (ref 3.5–5.2)
Sodium: 142 mmol/L (ref 134–144)
Total Protein: 7 g/dL (ref 6.0–8.5)
eGFR: 61 mL/min/{1.73_m2} (ref >59)

## 2023-02-24 LAB — CBC WITH DIFFERENTIAL/PLATELET
Basophils Absolute: 0.1 10*3/uL (ref 0.0–0.2)
Basos: 1 %
EOS (ABSOLUTE): 0.1 10*3/uL (ref 0.0–0.4)
Eos: 2 %
Hematocrit: 43.2 % (ref 37.5–51.0)
Hemoglobin: 14 g/dL (ref 13.0–17.7)
Immature Grans (Abs): 0 10*3/uL (ref 0.0–0.1)
Immature Granulocytes: 0 %
Lymphocytes Absolute: 2.3 10*3/uL (ref 0.7–3.1)
Lymphs: 30 %
MCH: 27.9 pg (ref 26.6–33.0)
MCHC: 32.4 g/dL (ref 31.5–35.7)
MCV: 86 fL (ref 79–97)
Monocytes Absolute: 0.7 10*3/uL (ref 0.1–0.9)
Monocytes: 9 %
Neutrophils Absolute: 4.6 10*3/uL (ref 1.4–7.0)
Neutrophils: 58 %
Platelets: 346 10*3/uL (ref 150–450)
RBC: 5.02 x10E6/uL (ref 4.14–5.80)
RDW: 12.4 % (ref 11.6–15.4)
WBC: 7.8 10*3/uL (ref 3.4–10.8)

## 2023-02-24 LAB — TSH+FREE T4
Free T4: 0.84 ng/dL (ref 0.82–1.77)
TSH: 2.28 u[IU]/mL (ref 0.450–4.500)

## 2023-02-24 LAB — LIPID PANEL
Chol/HDL Ratio: 2.6 ratio (ref 0.0–5.0)
Cholesterol, Total: 108 mg/dL (ref 100–199)
HDL: 41 mg/dL (ref >39)
LDL Chol Calc (NIH): 52 mg/dL (ref 0–99)
Triglycerides: 70 mg/dL (ref 0–149)
VLDL Cholesterol Cal: 15 mg/dL (ref 5–40)

## 2023-02-24 LAB — HEMOGLOBIN A1C
Est. average glucose Bld gHb Est-mCnc: 146 mg/dL
Hgb A1c MFr Bld: 6.7 % — ABNORMAL HIGH (ref 4.8–5.6)

## 2023-02-24 LAB — VITAMIN D 25 HYDROXY (VIT D DEFICIENCY, FRACTURES): Vit D, 25-Hydroxy: 55.4 ng/mL (ref 30.0–100.0)

## 2023-02-25 ENCOUNTER — Other Ambulatory Visit: Payer: Self-pay | Admitting: Family Medicine

## 2023-02-25 DIAGNOSIS — E1165 Type 2 diabetes mellitus with hyperglycemia: Secondary | ICD-10-CM

## 2023-02-25 MED ORDER — GLIMEPIRIDE 2 MG PO TABS: 2.0000 mg | ORAL_TABLET | Freq: Every day | ORAL | 3 refills | Status: DC

## 2023-02-25 NOTE — Progress Notes (Signed)
Please inform the patient that I have added glimepiride 2 mg daily to his treatment regimen. Advise him to continue taking metformin 1000 mg twice daily, while decreasing his intake of high-sugar foods and beverages, and increasing physical activity. His hemoglobin A1c has increased from 6.4 to 6.7.  Congratulate the patient on keeping his cholesterol well-controlled, and wish him a Happy Thanksgiving.

## 2023-03-15 ENCOUNTER — Other Ambulatory Visit: Payer: Self-pay | Admitting: Family Medicine

## 2023-03-15 DIAGNOSIS — F419 Anxiety disorder, unspecified: Secondary | ICD-10-CM

## 2023-04-04 ENCOUNTER — Other Ambulatory Visit: Payer: Self-pay | Admitting: Family Medicine

## 2023-04-04 DIAGNOSIS — R7301 Impaired fasting glucose: Secondary | ICD-10-CM

## 2023-04-04 DIAGNOSIS — F419 Anxiety disorder, unspecified: Secondary | ICD-10-CM

## 2023-06-02 ENCOUNTER — Other Ambulatory Visit: Payer: Self-pay | Admitting: Family Medicine

## 2023-06-02 DIAGNOSIS — F419 Anxiety disorder, unspecified: Secondary | ICD-10-CM

## 2023-06-03 ENCOUNTER — Other Ambulatory Visit: Payer: Self-pay | Admitting: Family Medicine

## 2023-06-03 DIAGNOSIS — H9313 Tinnitus, bilateral: Secondary | ICD-10-CM

## 2023-06-11 ENCOUNTER — Other Ambulatory Visit: Payer: Self-pay | Admitting: Family Medicine

## 2023-06-11 DIAGNOSIS — F419 Anxiety disorder, unspecified: Secondary | ICD-10-CM

## 2023-06-25 ENCOUNTER — Ambulatory Visit: Payer: 59 | Admitting: Family Medicine

## 2023-07-01 ENCOUNTER — Other Ambulatory Visit: Payer: Self-pay | Admitting: Family Medicine

## 2023-07-01 DIAGNOSIS — R7301 Impaired fasting glucose: Secondary | ICD-10-CM

## 2023-08-01 ENCOUNTER — Other Ambulatory Visit: Payer: Self-pay | Admitting: Family Medicine

## 2023-08-01 DIAGNOSIS — F419 Anxiety disorder, unspecified: Secondary | ICD-10-CM

## 2023-08-29 ENCOUNTER — Other Ambulatory Visit: Payer: Self-pay | Admitting: Family Medicine

## 2023-08-29 DIAGNOSIS — E559 Vitamin D deficiency, unspecified: Secondary | ICD-10-CM

## 2023-09-06 ENCOUNTER — Ambulatory Visit (INDEPENDENT_AMBULATORY_CARE_PROVIDER_SITE_OTHER): Admitting: Family Medicine

## 2023-09-06 ENCOUNTER — Encounter: Payer: Self-pay | Admitting: Family Medicine

## 2023-09-06 VITALS — BP 135/83 | HR 99 | Ht 74.0 in | Wt 339.1 lb

## 2023-09-06 DIAGNOSIS — I10 Essential (primary) hypertension: Secondary | ICD-10-CM

## 2023-09-06 DIAGNOSIS — Z7984 Long term (current) use of oral hypoglycemic drugs: Secondary | ICD-10-CM | POA: Diagnosis not present

## 2023-09-06 DIAGNOSIS — E559 Vitamin D deficiency, unspecified: Secondary | ICD-10-CM

## 2023-09-06 DIAGNOSIS — E7849 Other hyperlipidemia: Secondary | ICD-10-CM | POA: Diagnosis not present

## 2023-09-06 DIAGNOSIS — R7301 Impaired fasting glucose: Secondary | ICD-10-CM

## 2023-09-06 DIAGNOSIS — E1165 Type 2 diabetes mellitus with hyperglycemia: Secondary | ICD-10-CM

## 2023-09-06 DIAGNOSIS — E038 Other specified hypothyroidism: Secondary | ICD-10-CM

## 2023-09-06 MED ORDER — ROSUVASTATIN CALCIUM 10 MG PO TABS: 10.0000 mg | ORAL_TABLET | Freq: Every day | ORAL | 3 refills | Status: AC

## 2023-09-06 MED ORDER — AMLODIPINE-OLMESARTAN 10-40 MG PO TABS: 1.0000 | ORAL_TABLET | Freq: Every day | ORAL | 3 refills | Status: DC

## 2023-09-06 MED ORDER — METFORMIN HCL ER 500 MG PO TB24
1000.0000 mg | ORAL_TABLET | Freq: Every day | ORAL | 1 refills | Status: DC
Start: 2023-09-06 — End: 2023-12-28

## 2023-09-06 MED ORDER — GLIMEPIRIDE 2 MG PO TABS: 2.0000 mg | ORAL_TABLET | Freq: Every day | ORAL | 3 refills | Status: AC

## 2023-09-06 NOTE — Assessment & Plan Note (Signed)
 He takes metformin  1000 mg daily Denies polyuria, polydipsia, and polyphagia Will assess hemoglobin A1c today Encourage decreasing his intake of high sugar foods and beverages Lab Results  Component Value Date   HGBA1C 6.7 (H) 02/22/2023

## 2023-09-06 NOTE — Assessment & Plan Note (Signed)
 The patient's blood pressure is controlled in the clinic today. He report compliance with Olmesartan -Amlodipine  40-10 mg daily. The patient is asymptomatic in the clinic. Encouraged a low-sodium diet and increased physical activity to help maintain optimal blood pressure. BP Readings from Last 3 Encounters:  09/06/23 135/83  02/22/23 138/88  10/22/22 (!) 140/80

## 2023-09-06 NOTE — Patient Instructions (Signed)
 I appreciate the opportunity to provide care to you today!    Follow up:  5 months  Labs: please stop by the lab today to get your blood drawn (CBC, CMP, TSH, Lipid profile, HgA1c, Vit D)   For a Healthier YOU, I Recommend: Reducing your intake of sugar, sodium, carbohydrates, and saturated fats. Increasing your fiber intake by incorporating more whole grains, fruits, and vegetables into your meals. Setting healthy goals with a focus on lowering your consumption of carbs, sugar, and unhealthy fats. Adding variety to your diet by including a wide range of fruits and vegetables. Cutting back on soda and limiting processed foods as much as possible. Staying active: In addition to taking your weight loss medication, aim for at least 150 minutes of moderate-intensity physical activity each week for optimal results.      Please continue to a heart-healthy diet and increase your physical activities. Try to exercise for at least five days a week.    It was a pleasure to see you and I look forward to continuing to work together on your health and well-being. Please do not hesitate to call the office if you need care or have questions about your care.  In case of emergency, please visit the Emergency Department for urgent care, or contact our clinic at [phone] to schedule an appointment. We're here to help you!   Have a wonderful day and week. With Gratitude, Carliss Quast MSN, FNP-BC

## 2023-09-06 NOTE — Progress Notes (Signed)
 Established Patient Office Visit  Subjective:  Patient ID: Jesus Tapia, male    DOB: Nov 20, 1965  Age: 58 y.o. MRN: [id number]  CC:  Chief Complaint  Patient presents with   Medical Management of Chronic Issues    4 month f/u, reports family loss within this year.     HPI Jesus Tapia is a 58 y.o. male with past medical history of HTN and T2DM presents for f/u of  chronic medical conditions.  For the details of today's visit, please refer to the assessment and plan.     Past Medical History:  Diagnosis Date   Anxiety    Hypertension    OSA on CPAP    "might use CPAP once/month" (02/14/2015)    Past Surgical History:  Procedure Laterality Date   COLONOSCOPY WITH PROPOFOL  N/A 08/25/2019   Procedure: COLONOSCOPY WITH PROPOFOL ;  Surgeon: Ruby Corporal, MD;  Location: [facility];  Service: Endoscopy;  Laterality: N/A;  825   HERNIA REPAIR     UMBILICAL    JOINT REPLACEMENT     KNEE ARTHROSCOPY Right    POLYPECTOMY  08/25/2019   Procedure: POLYPECTOMY;  Surgeon: Ruby Corporal, MD;  Location: [facility];  Service: Endoscopy;;   TOTAL HIP ARTHROPLASTY Left 02/14/2015   anterior approach.   TOTAL HIP ARTHROPLASTY Left 02/14/2015   Procedure: TOTAL HIP ARTHROPLASTY ANTERIOR APPROACH;  Surgeon: Adonica Hoose, MD;  Location: [facility];  Service: Orthopedics;  Laterality: Left;   UMBILICAL HERNIA REPAIR  early 2000's?    History reviewed. No pertinent family history.  Social History   Socioeconomic History   Marital status: Married    Spouse name: Not on file   Number of children: Not on file   Years of education: Not on file   Highest education level: 12th grade  Occupational History   Not on file  Tobacco Use   Smoking status: Never   Smokeless tobacco: Never  Substance and Sexual Activity   Alcohol use: No   Drug use: No   Sexual activity: Yes  Other Topics Concern   Not on file  Social History Narrative   Not on file   Social Drivers of Health    Financial Resource Strain: Low Risk  (09/03/2023)   Overall Financial Resource Strain (CARDIA)    Difficulty of Paying Living Expenses: Not hard at all  Food Insecurity: No Food Insecurity (09/03/2023)   Hunger Vital Sign    Worried About Running Out of Food in the Last Year: Never true    Ran Out of Food in the Last Year: Never true  Transportation Needs: No Transportation Needs (09/03/2023)   PRAPARE - Administrator, Civil Service (Medical): No    Lack of Transportation (Non-Medical): No  Physical Activity: Insufficiently Active (09/03/2023)   Exercise Vital Sign    Days of Exercise per Week: 2 days    Minutes of Exercise per Session: 30 min  Stress: No Stress Concern Present (09/03/2023)   [employer] of Occupational Health - Occupational Stress Questionnaire    Feeling of Stress : Not at all  Social Connections: Moderately Integrated (09/03/2023)   Social Connection and Isolation Panel [NHANES]    Frequency of Communication with Friends and Family: Three times a week    Frequency of Social Gatherings with Friends and Family: Twice a week    Attends Religious Services: More than 4 times per year    Active Member of Clubs or Organizations: Yes  Attends Engineer, structural: More than 4 times per year    Marital Status: Divorced  Catering manager Violence: Not on file    Outpatient Medications Prior to Visit  Medication Sig Dispense Refill   busPIRone  (BUSPAR ) 10 MG tablet TAKE 1 TABLET BY MOUTH THREE TIMES DAILY 90 tablet 1   FT VITAMIN B-12 PR 1000 MCG TBCR TAKE 1 TABLET BY MOUTH ONCE DAILY 60 tablet 1   hydrOXYzine  (VISTARIL ) 25 MG capsule TAKE ONE CAPSULE BY MOUTH EVERY 8 HOURS AS NEEDED 30 capsule 2   sertraline  (ZOLOFT ) 50 MG tablet TAKE 1 TABLET BY MOUTH ONCE DAILY 30 tablet 3   tadalafil  (CIALIS ) 20 MG tablet Take 1 tablet (20 mg total) by mouth every other day as needed for erectile dysfunction. 15 tablet 0   Vitamin D , Ergocalciferol ,  (DRISDOL ) 1.25 MG (50000 UNIT) CAPS capsule TAKE ONE CAPSULE BY MOUTH ONCE a WEEK FOR 7 DAYS. 20 capsule 1   amLODipine -olmesartan  (AZOR ) 10-40 MG tablet Take 1 tablet by mouth daily. 90 tablet 3   glimepiride  (AMARYL ) 2 MG tablet Take 1 tablet (2 mg total) by mouth daily before breakfast. 90 tablet 3   metFORMIN  (GLUCOPHAGE -XR) 500 MG 24 hr tablet TAKE 2 TABLETS BY MOUTH ONCE DAILY 90 tablet 1   rosuvastatin  (CRESTOR ) 10 MG tablet Take 1 tablet (10 mg total) by mouth daily. 90 tablet 3   No facility-administered medications prior to visit.    No Known Allergies  ROS Review of Systems  Constitutional:  Negative for fatigue and fever.  Eyes:  Negative for visual disturbance.  Respiratory:  Negative for chest tightness and shortness of breath.   Cardiovascular:  Negative for chest pain and palpitations.  Neurological:  Negative for dizziness and headaches.      Objective:     Physical Exam HENT:     Head: Normocephalic.     Right Ear: External ear normal.     Left Ear: External ear normal.     Nose: No congestion or rhinorrhea.     Mouth/Throat:     Mouth: Mucous membranes are moist.  Cardiovascular:     Rate and Rhythm: Regular rhythm.     Heart sounds: No murmur heard. Pulmonary:     Effort: No respiratory distress.     Breath sounds: Normal breath sounds.  Neurological:     Mental Status: He is alert.     BP 135/83   Pulse 99   Ht 6\' 2"  (1.88 m)   Wt (!) 339 lb 1.3 oz (153.8 kg)   SpO2 95%   BMI 43.54 kg/m  Wt Readings from Last 3 Encounters:  09/06/23 (!) 339 lb 1.3 oz (153.8 kg)  02/22/23 (!) 328 lb 1.3 oz (148.8 kg)  10/22/22 (!) 320 lb 0.6 oz (145.2 kg)    Lab Results  Component Value Date   TSH 2.280 02/22/2023   Lab Results  Component Value Date   WBC 7.8 02/22/2023   HGB 14.0 02/22/2023   HCT 43.2 02/22/2023   MCV 86 02/22/2023   PLT 346 02/22/2023   Lab Results  Component Value Date   NA 142 02/22/2023   K 4.5 02/22/2023   CO2 20  02/22/2023   GLUCOSE 95 02/22/2023   BUN 14 02/22/2023   CREATININE 1.34 (H) 02/22/2023   BILITOT 0.4 02/22/2023   ALKPHOS 71 02/22/2023   AST 12 02/22/2023   ALT 6 02/22/2023   PROT 7.0 02/22/2023   ALBUMIN  4.0 02/22/2023  CALCIUM  9.5 02/22/2023   ANIONGAP 8 02/15/2015   EGFR 61 02/22/2023   Lab Results  Component Value Date   CHOL 108 02/22/2023   Lab Results  Component Value Date   HDL 41 02/22/2023   Lab Results  Component Value Date   LDLCALC 52 02/22/2023   Lab Results  Component Value Date   TRIG 70 02/22/2023   Lab Results  Component Value Date   CHOLHDL 2.6 02/22/2023   Lab Results  Component Value Date   HGBA1C 6.7 (H) 02/22/2023      Assessment & Plan:  Essential hypertension Assessment & Plan: The patient's blood pressure is controlled in the clinic today. He report compliance with Olmesartan -Amlodipine  40-10 mg daily. The patient is asymptomatic in the clinic. Encouraged a low-sodium diet and increased physical activity to help maintain optimal blood pressure. BP Readings from Last 3 Encounters:  09/06/23 135/83  02/22/23 138/88  10/22/22 (!) 140/80      Orders: -     amLODIPine -Olmesartan ; Take 1 tablet by mouth daily.  Dispense: 90 tablet; Refill: 3  Type 2 diabetes mellitus with hyperglycemia, without long-term current use of insulin (HCC) Assessment & Plan: He takes metformin  1000 mg daily Denies polyuria, polydipsia, and polyphagia Will assess hemoglobin A1c today Encourage decreasing his intake of high sugar foods and beverages Lab Results  Component Value Date   HGBA1C 6.7 (H) 02/22/2023     Orders: -     Glimepiride ; Take 1 tablet (2 mg total) by mouth daily before breakfast.  Dispense: 90 tablet; Refill: 3  Other hyperlipidemia -     Lipid panel -     CMP14+EGFR -     CBC with Differential/Platelet -     Rosuvastatin  Calcium ; Take 1 tablet (10 mg total) by mouth daily.  Dispense: 90 tablet; Refill: 3  IFG (impaired  fasting glucose) -     Hemoglobin A1c -     metFORMIN  HCl ER; Take 2 tablets (1,000 mg total) by mouth daily.  Dispense: 90 tablet; Refill: 1  Vitamin D  deficiency -     VITAMIN D  25 Hydroxy (Vit-D Deficiency, Fractures)  TSH (thyroid-stimulating hormone deficiency) -     TSH + free T4  Note: This chart has been completed using Engineer, civil (consulting) software, and while attempts have been made to ensure accuracy, certain words and phrases may not be transcribed as intended.    Follow-up: Return in about 5 months (around 02/06/2024).   Lindzey Zent, FNP

## 2023-09-07 LAB — CMP14+EGFR
ALT: 11 IU/L (ref 0–44)
AST: 14 IU/L (ref 0–40)
Albumin: 4.5 g/dL (ref 3.8–4.9)
Alkaline Phosphatase: 86 IU/L (ref 44–121)
BUN/Creatinine Ratio: 11 (ref 10–24)
BUN: 17 mg/dL (ref 8–27)
Bilirubin Total: 0.4 mg/dL (ref 0.0–1.2)
CO2: 22 mmol/L (ref 20–29)
Calcium: 9.4 mg/dL (ref 8.6–10.2)
Chloride: 100 mmol/L (ref 96–106)
Creatinine, Ser: 1.52 mg/dL — ABNORMAL HIGH (ref 0.76–1.27)
Globulin, Total: 2.9 g/dL (ref 1.5–4.5)
Glucose: 92 mg/dL (ref 70–99)
Potassium: 5.1 mmol/L (ref 3.5–5.2)
Sodium: 137 mmol/L (ref 134–144)
Total Protein: 7.4 g/dL (ref 6.0–8.5)
eGFR: 52 mL/min/{1.73_m2} — ABNORMAL LOW (ref >59)

## 2023-09-07 LAB — CBC WITH DIFFERENTIAL/PLATELET
Basophils Absolute: 0.1 10*3/uL (ref 0.0–0.2)
Basos: 1 %
EOS (ABSOLUTE): 0.1 10*3/uL (ref 0.0–0.4)
Eos: 1 %
Hematocrit: 47.9 % (ref 37.5–51.0)
Hemoglobin: 15 g/dL (ref 13.0–17.7)
Immature Grans (Abs): 0 10*3/uL (ref 0.0–0.1)
Immature Granulocytes: 0 %
Lymphocytes Absolute: 2.2 10*3/uL (ref 0.7–3.1)
Lymphs: 27 %
MCH: 27.8 pg (ref 26.6–33.0)
MCHC: 31.3 g/dL — ABNORMAL LOW (ref 31.5–35.7)
MCV: 89 fL (ref 79–97)
Monocytes Absolute: 0.6 10*3/uL (ref 0.1–0.9)
Monocytes: 8 %
Neutrophils Absolute: 5.3 10*3/uL (ref 1.4–7.0)
Neutrophils: 63 %
Platelets: 364 10*3/uL (ref 150–450)
RBC: 5.39 x10E6/uL (ref 4.14–5.80)
RDW: 13.1 % (ref 11.6–15.4)
WBC: 8.3 10*3/uL (ref 3.4–10.8)

## 2023-09-07 LAB — HEMOGLOBIN A1C
Est. average glucose Bld gHb Est-mCnc: 137 mg/dL
Hgb A1c MFr Bld: 6.4 % — ABNORMAL HIGH (ref 4.8–5.6)

## 2023-09-07 LAB — TSH+FREE T4
Free T4: 0.77 ng/dL — ABNORMAL LOW (ref 0.82–1.77)
TSH: 0.955 u[IU]/mL (ref 0.450–4.500)

## 2023-09-07 LAB — VITAMIN D 25 HYDROXY (VIT D DEFICIENCY, FRACTURES): Vit D, 25-Hydroxy: 57.4 ng/mL (ref 30.0–100.0)

## 2023-09-07 LAB — LIPID PANEL
Chol/HDL Ratio: 2.9 ratio (ref 0.0–5.0)
Cholesterol, Total: 115 mg/dL (ref 100–199)
HDL: 40 mg/dL (ref >39)
LDL Chol Calc (NIH): 58 mg/dL (ref 0–99)
Triglycerides: 87 mg/dL (ref 0–149)
VLDL Cholesterol Cal: 17 mg/dL (ref 5–40)

## 2023-09-10 ENCOUNTER — Other Ambulatory Visit: Payer: Self-pay | Admitting: Family Medicine

## 2023-09-10 DIAGNOSIS — F419 Anxiety disorder, unspecified: Secondary | ICD-10-CM

## 2023-09-12 ENCOUNTER — Ambulatory Visit: Payer: Self-pay | Admitting: Family Medicine

## 2023-09-30 ENCOUNTER — Other Ambulatory Visit: Payer: Self-pay | Admitting: Family Medicine

## 2023-09-30 DIAGNOSIS — F419 Anxiety disorder, unspecified: Secondary | ICD-10-CM

## 2023-09-30 DIAGNOSIS — H9313 Tinnitus, bilateral: Secondary | ICD-10-CM

## 2023-11-30 ENCOUNTER — Other Ambulatory Visit: Payer: Self-pay | Admitting: Family Medicine

## 2023-11-30 DIAGNOSIS — F419 Anxiety disorder, unspecified: Secondary | ICD-10-CM

## 2023-12-08 ENCOUNTER — Other Ambulatory Visit: Payer: Self-pay | Admitting: Family Medicine

## 2023-12-08 DIAGNOSIS — F419 Anxiety disorder, unspecified: Secondary | ICD-10-CM

## 2023-12-28 ENCOUNTER — Other Ambulatory Visit: Payer: Self-pay | Admitting: Family Medicine

## 2023-12-28 DIAGNOSIS — R7301 Impaired fasting glucose: Secondary | ICD-10-CM

## 2024-01-27 ENCOUNTER — Other Ambulatory Visit: Payer: Self-pay | Admitting: Family Medicine

## 2024-01-27 DIAGNOSIS — F419 Anxiety disorder, unspecified: Secondary | ICD-10-CM

## 2024-02-10 ENCOUNTER — Ambulatory Visit (INDEPENDENT_AMBULATORY_CARE_PROVIDER_SITE_OTHER): Admitting: Family Medicine

## 2024-02-10 ENCOUNTER — Encounter: Payer: Self-pay | Admitting: Family Medicine

## 2024-02-10 VITALS — BP 140/88 | HR 97 | Ht 74.0 in | Wt 344.0 lb

## 2024-02-10 DIAGNOSIS — E1169 Type 2 diabetes mellitus with other specified complication: Secondary | ICD-10-CM | POA: Diagnosis not present

## 2024-02-10 DIAGNOSIS — Z7984 Long term (current) use of oral hypoglycemic drugs: Secondary | ICD-10-CM

## 2024-02-10 DIAGNOSIS — E038 Other specified hypothyroidism: Secondary | ICD-10-CM | POA: Diagnosis not present

## 2024-02-10 DIAGNOSIS — E1165 Type 2 diabetes mellitus with hyperglycemia: Secondary | ICD-10-CM | POA: Diagnosis not present

## 2024-02-10 DIAGNOSIS — Z114 Encounter for screening for human immunodeficiency virus [HIV]: Secondary | ICD-10-CM | POA: Diagnosis not present

## 2024-02-10 DIAGNOSIS — E559 Vitamin D deficiency, unspecified: Secondary | ICD-10-CM | POA: Diagnosis not present

## 2024-02-10 DIAGNOSIS — E785 Hyperlipidemia, unspecified: Secondary | ICD-10-CM

## 2024-02-10 DIAGNOSIS — E782 Mixed hyperlipidemia: Secondary | ICD-10-CM

## 2024-02-10 DIAGNOSIS — I10 Essential (primary) hypertension: Secondary | ICD-10-CM

## 2024-02-10 MED ORDER — OLMESARTAN-AMLODIPINE-HCTZ 40-10-12.5 MG PO TABS: 1.0000 | ORAL_TABLET | Freq: Every day | ORAL | 1 refills | Status: DC

## 2024-02-10 NOTE — Assessment & Plan Note (Signed)
 Encouraged  to continue taking rosuvastatin  10 mg daily Lifestyle modifications were also discussed, including avoiding simple carbohydrates such as cakes, sweet desserts, ice cream, soda (diet or regular), sweet tea, candies, chips, cookies, store-bought juices, excessive alcohol (more than 1-2 drinks per day), lemonade, artificial sweeteners, donuts, coffee creamers, and sugar-free products. Additionally, the patient was advised to reduce the consumption of greasy, fatty foods and increase physical activity to support cardiovascular health. The patient verbalized understanding and is aware of the plan of care.

## 2024-02-10 NOTE — Progress Notes (Signed)
 Established Patient Office Visit  Subjective:  Patient ID: Jesus Tapia, male    DOB: February 26, 1966  Age: 58 y.o. MRN: [id number]  CC:  Chief Complaint  Patient presents with   Hypertension    Five month follow up    Obesity    Discuss weight loss options    HPI Jesus Tapia is a 58 y.o. male with past medical history of  HTN, T2DM, Anxiety presents for f/u of  chronic medical conditions.  For the details of today's visit, please refer to the assessment and plan.    Past Medical History:  Diagnosis Date   Anxiety    Hypertension    OSA on CPAP    might use CPAP once/month (02/14/2015)    Past Surgical History:  Procedure Laterality Date   COLONOSCOPY WITH PROPOFOL  N/A 08/25/2019   Procedure: COLONOSCOPY WITH PROPOFOL ;  Surgeon: Golda Claudis PENNER, MD;  Location: [facility];  Service: Endoscopy;  Laterality: N/A;  825   HERNIA REPAIR     UMBILICAL    JOINT REPLACEMENT     KNEE ARTHROSCOPY Right    POLYPECTOMY  08/25/2019   Procedure: POLYPECTOMY;  Surgeon: Golda Claudis PENNER, MD;  Location: [facility];  Service: Endoscopy;;   TOTAL HIP ARTHROPLASTY Left 02/14/2015   anterior approach.   TOTAL HIP ARTHROPLASTY Left 02/14/2015   Procedure: TOTAL HIP ARTHROPLASTY ANTERIOR APPROACH;  Surgeon: Redell Shoals, MD;  Location: [facility];  Service: Orthopedics;  Laterality: Left;   UMBILICAL HERNIA REPAIR  early 2000's?    No family history on file.  Social History   Socioeconomic History   Marital status: Married    Spouse name: Not on file   Number of children: Not on file   Years of education: Not on file   Highest education level: 12th grade  Occupational History   Not on file  Tobacco Use   Smoking status: Never   Smokeless tobacco: Never  Substance and Sexual Activity   Alcohol use: No   Drug use: No   Sexual activity: Yes  Other Topics Concern   Not on file  Social History Narrative   Not on file   Social Drivers of Health   Financial Resource  Strain: Low Risk  (09/03/2023)   Overall Financial Resource Strain (CARDIA)    Difficulty of Paying Living Expenses: Not hard at all  Food Insecurity: No Food Insecurity (09/03/2023)   Hunger Vital Sign    Worried About Running Out of Food in the Last Year: Never true    Ran Out of Food in the Last Year: Never true  Transportation Needs: No Transportation Needs (09/03/2023)   PRAPARE - Administrator, Civil Service (Medical): No    Lack of Transportation (Non-Medical): No  Physical Activity: Insufficiently Active (09/03/2023)   Exercise Vital Sign    Days of Exercise per Week: 2 days    Minutes of Exercise per Session: 30 min  Stress: No Stress Concern Present (09/03/2023)   [employer] of Occupational Health - Occupational Stress Questionnaire    Feeling of Stress : Not at all  Social Connections: Moderately Integrated (09/03/2023)   Social Connection and Isolation Panel    Frequency of Communication with Friends and Family: Three times a week    Frequency of Social Gatherings with Friends and Family: Twice a week    Attends Religious Services: More than 4 times per year    Active Member of Clubs or Organizations: Yes  Attends Engineer, Structural: More than 4 times per year    Marital Status: Divorced  Catering Manager Violence: Not on file    Outpatient Medications Prior to Visit  Medication Sig Dispense Refill   busPIRone  (BUSPAR ) 10 MG tablet TAKE 1 TABLET BY MOUTH THREE TIMES DAILY 90 tablet 1   FT VITAMIN B-12 PR 1000 MCG TBCR TAKE 1 TABLET BY MOUTH ONCE DAILY 60 tablet 1   glimepiride  (AMARYL ) 2 MG tablet Take 1 tablet (2 mg total) by mouth daily before breakfast. 90 tablet 3   hydrOXYzine  (VISTARIL ) 25 MG capsule TAKE ONE CAPSULE BY MOUTH EVERY 8 HOURS AS NEEDED 30 capsule 2   metFORMIN  (GLUCOPHAGE -XR) 500 MG 24 hr tablet TAKE 2 TABLETS BY MOUTH DAILY 90 tablet 1   rosuvastatin  (CRESTOR ) 10 MG tablet Take 1 tablet (10 mg total) by mouth daily. 90  tablet 3   sertraline  (ZOLOFT ) 50 MG tablet TAKE 1 TABLET BY MOUTH ONCE DAILY 30 tablet 3   tadalafil  (CIALIS ) 20 MG tablet Take 1 tablet (20 mg total) by mouth every other day as needed for erectile dysfunction. 15 tablet 0   Vitamin D , Ergocalciferol , (DRISDOL ) 1.25 MG (50000 UNIT) CAPS capsule TAKE ONE CAPSULE BY MOUTH ONCE a WEEK FOR 7 DAYS. 20 capsule 1   amLODipine -olmesartan  (AZOR ) 10-40 MG tablet Take 1 tablet by mouth daily. 90 tablet 3   No facility-administered medications prior to visit.    No Known Allergies  ROS Review of Systems  Constitutional:  Negative for fatigue and fever.  Eyes:  Negative for visual disturbance.  Respiratory:  Negative for chest tightness and shortness of breath.   Cardiovascular:  Negative for chest pain and palpitations.  Neurological:  Negative for dizziness and headaches.      Objective:    Physical Exam HENT:     Head: Normocephalic.     Right Ear: External ear normal.     Left Ear: External ear normal.     Nose: No congestion or rhinorrhea.     Mouth/Throat:     Mouth: Mucous membranes are moist.  Cardiovascular:     Rate and Rhythm: Regular rhythm.     Heart sounds: No murmur heard. Pulmonary:     Effort: No respiratory distress.     Breath sounds: Normal breath sounds.  Neurological:     Mental Status: He is alert.     BP (!) 140/88   Pulse 97   Ht 6' 2 (1.88 m)   Wt (!) 344 lb (156 kg)   SpO2 94%   BMI 44.17 kg/m  Wt Readings from Last 3 Encounters:  02/10/24 (!) 344 lb (156 kg)  09/06/23 (!) 339 lb 1.3 oz (153.8 kg)  02/22/23 (!) 328 lb 1.3 oz (148.8 kg)    Lab Results  Component Value Date   TSH 0.955 09/06/2023   Lab Results  Component Value Date   WBC 8.3 09/06/2023   HGB 15.0 09/06/2023   HCT 47.9 09/06/2023   MCV 89 09/06/2023   PLT 364 09/06/2023   Lab Results  Component Value Date   NA 137 09/06/2023   K 5.1 09/06/2023   CO2 22 09/06/2023   GLUCOSE 92 09/06/2023   BUN 17 09/06/2023    CREATININE 1.52 (H) 09/06/2023   BILITOT 0.4 09/06/2023   ALKPHOS 86 09/06/2023   AST 14 09/06/2023   ALT 11 09/06/2023   PROT 7.4 09/06/2023   ALBUMIN  4.5 09/06/2023   CALCIUM  9.4 09/06/2023  ANIONGAP 8 02/15/2015   EGFR 52 (L) 09/06/2023   Lab Results  Component Value Date   CHOL 115 09/06/2023   Lab Results  Component Value Date   HDL 40 09/06/2023   Lab Results  Component Value Date   LDLCALC 58 09/06/2023   Lab Results  Component Value Date   TRIG 87 09/06/2023   Lab Results  Component Value Date   CHOLHDL 2.9 09/06/2023   Lab Results  Component Value Date   HGBA1C 6.4 (H) 09/06/2023      Assessment & Plan:  Essential hypertension Assessment & Plan: Uncontrolled blood pressure noted in clinic.  Patient is currently asymptomatic. Discontinue current antihypertensive regimen. Start amlodipine -olmesartan -HCTZ 40/10/12.5 mg PO daily. Encourage low-sodium diet and increased physical activity as tolerated. Follow-up in 4 weeks for nurse visit to reassess blood pressure and evaluate medication response. Reviewed potential side effects, importance of adherence, and lifestyle modifications. Patient verbalized understanding and agreement with the plan.   Orders: -     Olmesartan -amLODIPine -HCTZ; Take 1 tablet by mouth daily.  Dispense: 30 tablet; Refill: 1  Type 2 diabetes mellitus with hyperglycemia, without long-term current use of insulin (HCC) Assessment & Plan: He takes metformin  1000 mg daily and glimepiride  2 mg daily Denies polyuria, polydipsia, and polyphagia Will assess hemoglobin A1c today Encourage decreasing his intake of high sugar foods and beverages Lab Results  Component Value Date   HGBA1C 6.4 (H) 09/06/2023     Orders: -     Hemoglobin A1c -     Microalbumin / creatinine urine ratio -     HM Diabetes Foot Exam  Hyperlipidemia associated with type 2 diabetes mellitus (HCC) Assessment & Plan: Encouraged  to continue taking  rosuvastatin  10 mg daily Lifestyle modifications were also discussed, including avoiding simple carbohydrates such as cakes, sweet desserts, ice cream, soda (diet or regular), sweet tea, candies, chips, cookies, store-bought juices, excessive alcohol (more than 1-2 drinks per day), lemonade, artificial sweeteners, donuts, coffee creamers, and sugar-free products. Additionally, the patient was advised to reduce the consumption of greasy, fatty foods and increase physical activity to support cardiovascular health. The patient verbalized understanding and is aware of the plan of care.    Encounter for screening for HIV -     HIV Antibody (routine testing w rflx)  Vitamin D  deficiency -     VITAMIN D  25 Hydroxy (Vit-D Deficiency, Fractures)  TSH (thyroid-stimulating hormone deficiency) -     TSH + free T4  Mixed hyperlipidemia -     Lipid panel -     CMP14+EGFR -     CBC with Differential/Platelet   Note: This chart has been completed using Engineer, Civil (consulting) software, and while attempts have been made to ensure accuracy, certain words and phrases may not be transcribed as intended.   Follow-up: Return in about 4 months (around 06/09/2024).   Cashel Bellina  Z Bacchus, FNP

## 2024-02-10 NOTE — Assessment & Plan Note (Signed)
 He takes metformin  1000 mg daily and glimepiride  2 mg daily Denies polyuria, polydipsia, and polyphagia Will assess hemoglobin A1c today Encourage decreasing his intake of high sugar foods and beverages Lab Results  Component Value Date   HGBA1C 6.4 (H) 09/06/2023

## 2024-02-10 NOTE — Patient Instructions (Addendum)
 I appreciate the opportunity to provide care to you today!    Follow up:  4 months/ 1 month with RN for BP followup  Labs: please stop by the lab today to get your blood drawn (CBC, CMP, TSH, Lipid profile, HgA1c, Vit D)  Hypertension Management  Your current blood pressure is above the target goal of <140/90 mmHg. To address this, please start taking amlodipine - olmesartan -HCTZ 40-10-12.5 mg daily  Medication Instructions: Take your blood pressure medication at the same time each day. After taking your medication, check your blood pressure at least an hour later. If your first reading is >140/90 mmHg, wait at least 10 minutes and recheck your blood pressure. Side Effects: In the initial days of therapy, you may experience dizziness or lightheadedness as your body adjusts to the lower blood pressure; this is expected. Diet and Lifestyle: Adhere to a low-sodium diet, limiting intake to less than 1500 mg daily, and increase your physical activity. Avoid over-the-counter NSAIDs such as ibuprofen and naproxen while on this medication. Hydration and Nutrition: Stay well-hydrated by drinking at least 64 ounces of water daily. Increase your servings of fruits and vegetables and avoid excessive sodium in your diet. Long-Term Considerations: Uncontrolled hypertension can increase the risk of cardiovascular diseases, including stroke, coronary artery disease, and heart failure.  Please report to the emergency department if your blood pressure exceeds 180/120 and is accompanied by symptoms such as headaches, chest pain, palpitations, blurred vision, or dizziness.    Please follow up if your symptoms worsen or fail to improve.    Please continue to a heart-healthy diet and increase your physical activities. Try to exercise for at least five days a week.    It was a pleasure to see you and I look forward to continuing to work together on your health and well-being. Please do not hesitate to call  the office if you need care or have questions about your care.  In case of emergency, please visit the Emergency Department for urgent care, or contact our clinic at [phone] to schedule an appointment. We're here to help you!   Have a wonderful day and week. With Gratitude, Meade JENEANE Gerlach MSN, FNP-BC, PMHNP-BC

## 2024-02-10 NOTE — Assessment & Plan Note (Signed)
 Uncontrolled blood pressure noted in clinic.  Patient is currently asymptomatic. Discontinue current antihypertensive regimen. Start amlodipine -olmesartan -HCTZ 40/10/12.5 mg PO daily. Encourage low-sodium diet and increased physical activity as tolerated. Follow-up in 4 weeks for nurse visit to reassess blood pressure and evaluate medication response. Reviewed potential side effects, importance of adherence, and lifestyle modifications. Patient verbalized understanding and agreement with the plan.

## 2024-02-11 LAB — CBC WITH DIFFERENTIAL/PLATELET
Basophils Absolute: 0.1 x10E3/uL (ref 0.0–0.2)
Basos: 1 %
EOS (ABSOLUTE): 0.1 x10E3/uL (ref 0.0–0.4)
Eos: 1 %
Hematocrit: 43.2 % (ref 37.5–51.0)
Hemoglobin: 14.3 g/dL (ref 13.0–17.7)
Immature Grans (Abs): 0 x10E3/uL (ref 0.0–0.1)
Immature Granulocytes: 0 %
Lymphocytes Absolute: 1.7 x10E3/uL (ref 0.7–3.1)
Lymphs: 18 %
MCH: 28.7 pg (ref 26.6–33.0)
MCHC: 33.1 g/dL (ref 31.5–35.7)
MCV: 87 fL (ref 79–97)
Monocytes Absolute: 0.7 x10E3/uL (ref 0.1–0.9)
Monocytes: 8 %
Neutrophils Absolute: 6.8 x10E3/uL (ref 1.4–7.0)
Neutrophils: 72 %
Platelets: 387 x10E3/uL (ref 150–450)
RBC: 4.99 x10E6/uL (ref 4.14–5.80)
RDW: 12.9 % (ref 11.6–15.4)
WBC: 9.3 x10E3/uL (ref 3.4–10.8)

## 2024-02-11 LAB — TSH+FREE T4
Free T4: 0.87 ng/dL (ref 0.82–1.77)
TSH: 1.43 u[IU]/mL (ref 0.450–4.500)

## 2024-02-11 LAB — CMP14+EGFR
ALT: 11 IU/L (ref 0–44)
AST: 21 IU/L (ref 0–40)
Albumin: 4.5 g/dL (ref 3.8–4.9)
Alkaline Phosphatase: 78 IU/L (ref 47–123)
BUN/Creatinine Ratio: 13 (ref 10–24)
BUN: 17 mg/dL (ref 8–27)
Bilirubin Total: 0.5 mg/dL (ref 0.0–1.2)
CO2: 22 mmol/L (ref 20–29)
Calcium: 9.8 mg/dL (ref 8.6–10.2)
Chloride: 103 mmol/L (ref 96–106)
Creatinine, Ser: 1.35 mg/dL — ABNORMAL HIGH (ref 0.76–1.27)
Globulin, Total: 3.4 g/dL (ref 1.5–4.5)
Glucose: 119 mg/dL — ABNORMAL HIGH (ref 70–99)
Potassium: 4.4 mmol/L (ref 3.5–5.2)
Sodium: 141 mmol/L (ref 134–144)
Total Protein: 7.9 g/dL (ref 6.0–8.5)
eGFR: 60 mL/min/1.73 (ref >59)

## 2024-02-11 LAB — VITAMIN D 25 HYDROXY (VIT D DEFICIENCY, FRACTURES): Vit D, 25-Hydroxy: 60.2 ng/mL (ref 30.0–100.0)

## 2024-02-11 LAB — LIPID PANEL
Chol/HDL Ratio: 2.8 ratio (ref 0.0–5.0)
Cholesterol, Total: 121 mg/dL (ref 100–199)
HDL: 43 mg/dL (ref >39)
LDL Chol Calc (NIH): 63 mg/dL (ref 0–99)
Triglycerides: 74 mg/dL (ref 0–149)
VLDL Cholesterol Cal: 15 mg/dL (ref 5–40)

## 2024-02-11 LAB — HIV ANTIBODY (ROUTINE TESTING W REFLEX): HIV Screen 4th Generation wRfx: NONREACTIVE

## 2024-02-11 LAB — HEMOGLOBIN A1C
Est. average glucose Bld gHb Est-mCnc: 137 mg/dL
Hgb A1c MFr Bld: 6.4 % — ABNORMAL HIGH (ref 4.8–5.6)

## 2024-02-20 ENCOUNTER — Ambulatory Visit: Payer: Self-pay | Admitting: Family Medicine

## 2024-03-07 ENCOUNTER — Other Ambulatory Visit: Payer: Self-pay | Admitting: Family Medicine

## 2024-03-07 DIAGNOSIS — F419 Anxiety disorder, unspecified: Secondary | ICD-10-CM

## 2024-03-13 ENCOUNTER — Ambulatory Visit

## 2024-03-13 DIAGNOSIS — I10 Essential (primary) hypertension: Secondary | ICD-10-CM

## 2024-03-13 MED ORDER — OLMESARTAN-AMLODIPINE-HCTZ 40-10-12.5 MG PO TABS: 1.0000 | ORAL_TABLET | Freq: Every day | ORAL | 1 refills | Status: AC

## 2024-03-13 NOTE — Progress Notes (Signed)
 Patient is in office today for a nurse visit for Blood Pressure Check. Patient blood pressure was 138/80, Patient No chest pain, No shortness of breath, No dyspnea on exertion, No orthopnea, No paroxysmal nocturnal dyspnea, No edema, No palpitations, No syncope.

## 2024-03-27 ENCOUNTER — Other Ambulatory Visit: Payer: Self-pay | Admitting: Family Medicine

## 2024-03-27 DIAGNOSIS — R7301 Impaired fasting glucose: Secondary | ICD-10-CM

## 2024-03-27 DIAGNOSIS — F419 Anxiety disorder, unspecified: Secondary | ICD-10-CM

## 2024-04-02 ENCOUNTER — Other Ambulatory Visit: Payer: Self-pay | Admitting: Family Medicine

## 2024-04-02 DIAGNOSIS — H9313 Tinnitus, bilateral: Secondary | ICD-10-CM

## 2024-04-04 ENCOUNTER — Telehealth: Payer: Self-pay

## 2024-04-04 NOTE — Telephone Encounter (Signed)
 Copied from CRM [id number]. Topic: Clinical - Medication Question >> Apr 04, 2024  3:25 PM Amber H wrote: Reason for CRM: Patient stated he spoke to a doctor and was wanting to get on the GLP weight loss supplement due his type 2 diabetes. He wanted to know if provider could prescribe him the medication.    [phone]

## 2024-04-06 ENCOUNTER — Telehealth: Payer: Self-pay

## 2024-04-06 NOTE — Telephone Encounter (Signed)
 Copied from CRM [id number]. Topic: Clinical - Medication Question >> Apr 04, 2024  3:25 PM Amber H wrote: Reason for CRM: Patient stated he spoke to a doctor and was wanting to get on the GLP weight loss supplement due his type 2 diabetes. He wanted to know if provider could prescribe him the medication.    [phone]

## 2024-04-07 ENCOUNTER — Telehealth: Payer: Self-pay

## 2024-04-07 NOTE — Telephone Encounter (Signed)
 Waiting on prescriber response

## 2024-04-07 NOTE — Telephone Encounter (Signed)
 Copied from CRM [id number]. Topic: Clinical - Medication Question >> Apr 04, 2024  3:25 PM Amber H wrote: Reason for CRM: Patient stated he spoke to a doctor and was wanting to get on the GLP weight loss supplement due his type 2 diabetes. He wanted to know if provider could prescribe him the medication.    Jesus Tapia- [phone] >> Apr 07, 2024  3:25 PM Travis F wrote: Patient is calling in checking on the status of this request. Patient says he has not heard anything from his provider. Please advise.  >> Apr 05, 2024  2:13 PM Tinnie C wrote: Pt calling to follow up on this request, please return call at [phone]

## 2024-04-07 NOTE — Progress Notes (Signed)
 Jesus Tapia                                          MRN: [id number]   04/07/2024   The [facility] Quality Team Specialist reviewed this patient medical record for the purposes of chart review for care gap closure. The following were reviewed: chart review for care gap closure-controlling blood pressure.    [facility] Quality Team

## 2024-04-07 NOTE — Telephone Encounter (Signed)
Duplicate encounter , message sent to PCP

## 2024-04-11 ENCOUNTER — Telehealth: Payer: Self-pay | Admitting: Family Medicine

## 2024-04-11 NOTE — Telephone Encounter (Signed)
" °  Copied from CRM [id number]. Topic: Complaint (DO NOT CONVERT) - Provider (sensitive) >> Apr 11, 2024 11:05 AM Eva FALCON wrote: Date of Incident: 04/11/24 Details of complaint: Pt is upset that provider has not responded to the request of his GLP medication, states he has been calling since 12/30 and it looks like its just awaiting PCP response. States it should be common courtesy to just respond whether it can be sent or not. States its not a good look that its been days and nothing.  How would the patient like to see it resolved? To receive call back from office manager.  On a scale of 1-10, how was your experience? Did not provide.  What would it take to bring it to a 10? To hear from office manager to discuss further.  Route to Research Officer, [employer]. "

## 2024-04-12 NOTE — Telephone Encounter (Signed)
 Patient wants a cal from somebody today. Can a CMA please call him and discuss what he is waiting on he has tried to get in touch with the supervisor because he is very upset. Requesting a call by the end of the day. Thanks

## 2024-04-13 ENCOUNTER — Other Ambulatory Visit: Payer: Self-pay | Admitting: Family Medicine

## 2024-04-13 DIAGNOSIS — R7301 Impaired fasting glucose: Secondary | ICD-10-CM

## 2024-04-13 DIAGNOSIS — E1165 Type 2 diabetes mellitus with hyperglycemia: Secondary | ICD-10-CM

## 2024-04-13 MED ORDER — OZEMPIC (0.25 OR 0.5 MG/DOSE) 2 MG/3ML ~~LOC~~ SOPN: 0.2500 mg | PEN_INJECTOR | SUBCUTANEOUS | 0 refills | Status: AC

## 2024-04-13 MED ORDER — METFORMIN HCL ER 500 MG PO TB24: 500.0000 mg | ORAL_TABLET | Freq: Every day | ORAL | 1 refills | Status: AC

## 2024-04-13 NOTE — Telephone Encounter (Signed)
 I called and spoke with the patient, informing him that the provider had been under the weather due to a medical condition, which contributed to the delay in responding to his message sent on 04/04/2024. The patient was understanding, adjustments to the treatment regimen were made, and he expressed satisfaction with the plan.

## 2024-04-13 NOTE — Progress Notes (Signed)
 I called and spoke with the patient, and a prescription for Ozempic  0.25 mg weekly has been sent to the pharmacy. His most recent HbA1c, obtained two months ago, was 6.4. He was encouraged to discontinue glimepiride  2 mg daily and to begin taking metformin  500 mg daily once he starts Ozempic . He was also encouraged to request monthly refills for Ozempic . Laboratory values will be reassessed at his follow-up visit.

## 2024-04-14 ENCOUNTER — Telehealth: Payer: Self-pay | Admitting: Pharmacy Technician

## 2024-04-14 ENCOUNTER — Other Ambulatory Visit (HOSPITAL_COMMUNITY): Payer: Self-pay

## 2024-04-14 NOTE — Telephone Encounter (Signed)
 Pharmacy Patient Advocate Encounter   Received notification from [facility] KEY that prior authorization for Ozempic  (0.25 or 0.5 MG/DOSE) 2MG /3ML pen-injectors is required/requested.   Insurance verification completed.   The patient is insured through [facility].   Per test claim: PA required; PA submitted to above mentioned insurance via Latent Key/confirmation #/EOC [id number] Status is pending

## 2024-04-18 ENCOUNTER — Telehealth: Payer: Self-pay

## 2024-04-18 ENCOUNTER — Other Ambulatory Visit (HOSPITAL_COMMUNITY): Payer: Self-pay

## 2024-04-18 NOTE — Telephone Encounter (Signed)
 Copied from CRM [id number]. Topic: Clinical - Medication Prior Auth >> Apr 18, 2024 10:54 AM Tiffini S wrote: Reason for CRM: Patient have new insurance- needs a pa with [employer] for Semaglutide ,0.25 or 0.5MG /DOS, (OZEMPIC , 0.25 OR 0.5 MG/DOSE,) 2 MG/3ML SOPN  Patient contact number is [phone]

## 2024-04-19 NOTE — Telephone Encounter (Signed)
 Pharmacy Patient Advocate Encounter  Received notification from [facility] that Prior Authorization for Ozempic  (0.25 or 0.5 MG/DOSE) 2MG /3ML pen-injectors has been DENIED.  Full denial letter will be uploaded to the media tab. See denial reason below.   PA #/Case ID/Reference #: [id number]

## 2024-04-19 NOTE — Telephone Encounter (Signed)
 PA request has been Started. New Encounter has been or will be created for follow up. For additional info see Pharmacy Prior Auth telephone encounter from 04/14/2024.

## 2024-04-20 ENCOUNTER — Telehealth: Payer: Self-pay

## 2024-04-20 ENCOUNTER — Other Ambulatory Visit (HOSPITAL_COMMUNITY): Payer: Self-pay

## 2024-04-20 NOTE — Telephone Encounter (Signed)
 Kindly forward the denial letter to the patient

## 2024-04-20 NOTE — Telephone Encounter (Signed)
 Kindly iniate a PA

## 2024-04-20 NOTE — Telephone Encounter (Signed)
 Sent to patient.

## 2024-04-20 NOTE — Telephone Encounter (Signed)
 Copied from CRM [id number]. Topic: Clinical - Medication Prior Auth >> Apr 18, 2024 10:54 AM Tiffini S wrote: Reason for CRM: Patient have new insurance- needs a pa with [employer] for Semaglutide ,0.25 or 0.5MG /DOS, (OZEMPIC , 0.25 OR 0.5 MG/DOSE,) 2 MG/3ML SOPN  Patient contact number is [phone] >> Apr 20, 2024 11:40 AM Emylou G wrote: Patient called.. said he just got denied on his med.. They need more instructions if he is using it with metformin .. for diabetes.. Pls review for patient.  They want us  to resubmit back to them for more specifics

## 2024-05-02 NOTE — Progress Notes (Signed)
 Jesus Tapia                                          MRN: [id number]   05/02/2024   The [facility] Quality Team Specialist reviewed this patient medical record for the purposes of chart review for care gap closure. The following were reviewed: chart review for care gap closure-diabetic eye exam.    [facility] Quality Team

## 2024-05-04 ENCOUNTER — Other Ambulatory Visit: Payer: Self-pay | Admitting: Family Medicine

## 2024-05-04 DIAGNOSIS — F419 Anxiety disorder, unspecified: Secondary | ICD-10-CM

## 2024-05-05 ENCOUNTER — Telehealth: Admitting: Family Medicine

## 2024-05-05 ENCOUNTER — Telehealth: Admitting: Physician Assistant

## 2024-05-05 ENCOUNTER — Ambulatory Visit: Payer: Self-pay

## 2024-05-05 DIAGNOSIS — R197 Diarrhea, unspecified: Secondary | ICD-10-CM | POA: Diagnosis not present

## 2024-05-05 DIAGNOSIS — Z91199 Patient's noncompliance with other medical treatment and regimen due to unspecified reason: Secondary | ICD-10-CM

## 2024-05-05 MED ORDER — ONDANSETRON HCL 4 MG PO TABS: 4.0000 mg | ORAL_TABLET | Freq: Three times a day (TID) | ORAL | 0 refills | Status: AC | PRN

## 2024-05-05 NOTE — Progress Notes (Signed)
 " Virtual Visit Consent   Jesus Tapia, you are scheduled for a virtual visit with a [facility] provider today. Just as with appointments in the office, your consent must be obtained to participate. Your consent will be active for this visit and any virtual visit you may have with one of our providers in the next 365 days. If you have a MyChart account, a copy of this consent can be sent to you electronically.  As this is a virtual visit, video technology does not allow for your provider to perform a traditional examination. This may limit your provider's ability to fully assess your condition. If your provider identifies any concerns that need to be evaluated in person or the need to arrange testing (such as labs, EKG, etc.), we will make arrangements to do so. Although advances in technology are sophisticated, we cannot ensure that it will always work on either your end or our end. If the connection with a video visit is poor, the visit may have to be switched to a telephone visit. With either a video or telephone visit, we are not always able to ensure that we have a secure connection.  By engaging in this virtual visit, you consent to the provision of healthcare and authorize for your insurance to be billed (if applicable) for the services provided during this visit. Depending on your insurance coverage, you may receive a charge related to this service.  I need to obtain your verbal consent now. Are you willing to proceed with your visit today? CHARVIS LIGHTNER has provided verbal consent on 05/05/2024 for a virtual visit (video or telephone). Loa Lamp, FNP  Date: 05/05/2024 4:47 PM   Virtual Visit via Video Note   I, Loa Lamp, connected with  Jesus Tapia  ([id number], [birth year]) on 05/05/24 at  4:45 PM EST by a video-enabled telemedicine application and verified that I am speaking with the correct person using two identifiers.  Location: Patient: Virtual Visit Location Patient:  Home Provider: Virtual Visit Location Provider: Home Office   I discussed the limitations of evaluation and management by telemedicine and the availability of in person appointments. The patient expressed understanding and agreed to proceed.    History of Present Illness: Jesus Tapia is a 59 y.o. who identifies as a male who was assigned male at birth, and is being seen today for diarrhea, hot and cold, sx since Tuesday, fever. No vomiting. No nausea. Diarrhea 3-4 times a day. Liquid.   HPI: HPI  Problems:  Patient Active Problem List   Diagnosis Date Noted   Hyperlipidemia associated with type 2 diabetes mellitus (HCC) 02/10/2024   Type 2 diabetes mellitus with hyperglycemia (HCC) 05/31/2022   Essential hypertension 01/23/2022   Hematuria 11/24/2021   Tinnitus, bilateral 11/24/2021   Anxiety 09/23/2021   Osteoarthritis of left hip 02/14/2015    Allergies: Allergies[1] Medications: Current Medications[2]  Observations/Objective: Patient is well-developed, well-nourished in no acute distress.  Resting comfortably  at home.  Head is normocephalic, atraumatic.  No labored breathing.  Speech is clear and coherent with logical content.  Patient is alert and oriented at baseline.    Assessment and Plan: There are no diagnoses linked to this encounter. Increase fluids, No milk or dairy, BRATT diet. UC if sx worsen.   Follow Up Instructions: I discussed the assessment and treatment plan with the patient. The patient was provided an opportunity to ask questions and all were answered. The patient agreed with the plan and demonstrated  an understanding of the instructions.  A copy of instructions were sent to the patient via MyChart unless otherwise noted below.     The patient was advised to call back or seek an in-person evaluation if the symptoms worsen or if the condition fails to improve as anticipated.    [name], FNP     [1] No Known Allergies [2]  Current  Outpatient Medications:    busPIRone  (BUSPAR ) 10 MG tablet, TAKE 1 TABLET BY MOUTH THREE TIMES DAILY, Disp: 90 tablet, Rfl: 1   FT VITAMIN B-12 PR 1000 MCG TBCR, TAKE 1 TABLET BY MOUTH ONCE DAILY, Disp: 60 tablet, Rfl: 1   glimepiride  (AMARYL ) 2 MG tablet, Take 1 tablet (2 mg total) by mouth daily before breakfast., Disp: 90 tablet, Rfl: 3   hydrOXYzine  (VISTARIL ) 25 MG capsule, TAKE ONE CAPSULE BY MOUTH EVERY 8 HOURS AS NEEDED, Disp: 30 capsule, Rfl: 2   metFORMIN  (GLUCOPHAGE -XR) 500 MG 24 hr tablet, Take 1 tablet (500 mg total) by mouth daily., Disp: 90 tablet, Rfl: 1   Olmesartan -amLODIPine -HCTZ 40-10-12.5 MG TABS, Take 1 tablet by mouth daily., Disp: 30 tablet, Rfl: 1   rosuvastatin  (CRESTOR ) 10 MG tablet, Take 1 tablet (10 mg total) by mouth daily., Disp: 90 tablet, Rfl: 3   Semaglutide ,0.25 or 0.5MG /DOS, (OZEMPIC , 0.25 OR 0.5 MG/DOSE,) 2 MG/3ML SOPN, Inject 0.25 mg into the skin once a week., Disp: 3 mL, Rfl: 0   sertraline  (ZOLOFT ) 50 MG tablet, TAKE 1 TABLET BY MOUTH ONCE DAILY, Disp: 30 tablet, Rfl: 3   tadalafil  (CIALIS ) 20 MG tablet, Take 1 tablet (20 mg total) by mouth every other day as needed for erectile dysfunction., Disp: 15 tablet, Rfl: 0   Vitamin D , Ergocalciferol , (DRISDOL ) 1.25 MG (50000 UNIT) CAPS capsule, TAKE ONE CAPSULE BY MOUTH ONCE a WEEK FOR 7 DAYS., Disp: 20 capsule, Rfl: 1  "

## 2024-05-05 NOTE — Progress Notes (Signed)
 The patient no-showed for appointment despite this provider sending direct link, reaching out via phone with no response and waiting for at least 10 minutes from appointment time for patient to join. They will be marked as a NS for this appointment/time.   Laure Kidney, PA-C

## 2024-05-05 NOTE — Telephone Encounter (Signed)
 FYI Only or Action Required?: FYI only for provider: appointment scheduled on 05/05/24 for virtual UC.  Patient was last seen in primary care on 02/10/2024 by Edman Meade PEDLAR, FNP.  Called Nurse Triage reporting Diarrhea.  Symptoms began several days ago.  Interventions attempted: Nothing.  Symptoms are: unchanged.  Triage Disposition: See HCP Within 4 Hours (Or PCP Triage)  Patient/caregiver understands and will follow disposition?: Yes  Reason for Disposition  [1] SEVERE diarrhea (e.g., 7 or more times / day more than normal) AND [2] age > 60 years  Answer Assessment - Initial Assessment Questions Pt calling to report diarrhea symptoms - onset 05/02/24, pain comes and goes, unable to eat, going to the bathroom often (7 or more times since onset). Pt able to take in fluids.   Pt scheduled for a virtual UC appt.  1. DIARRHEA SEVERITY: How bad is the diarrhea? How many more stools have you had in the past 24 hours than normal?      7x since onset on 05/02/24 2. ONSET: When did the diarrhea begin?      05/02/24 3. STOOL DESCRIPTION:  How loose or watery is the diarrhea? What is the stool color? Is there any blood or mucous in the stool?     Loose stools  4. VOMITING: Are you also vomiting? If Yes, ask: How many times in the past 24 hours?      Denies  5. ABDOMEN PAIN: Are you having any abdomen pain? If Yes, ask: What does it feel like? (e.g., crampy, dull, intermittent, constant)      On and off 6. ABDOMEN PAIN SEVERITY: If present, ask: How bad is the pain?  (e.g., Scale 1-10; mild, moderate, or severe)     Mild currently 7. ORAL INTAKE: If vomiting, Have you been able to drink liquids? How much liquids have you had in the past 24 hours?     Yes - gatorade 8. HYDRATION: Any signs of dehydration? (e.g., dry mouth [not just dry lips], too weak to stand, dizziness, new weight loss) When did you last urinate?     N/a  9. EXPOSURE: Have you traveled to a  foreign country recently? Have you been exposed to anyone with diarrhea? Could you have eaten any food that was spoiled?     N/a  10. ANTIBIOTIC USE: Are you taking antibiotics now or have you taken antibiotics in the past 2 months?       N/a  11. OTHER SYMPTOMS: Do you have any other symptoms? (e.g., fever, blood in stool)       Denies  Protocols used: Diarrhea-A-AH  Reason for Triage: Patient states he thinks he has stomach virus. Stomach has been in extreme pain for 2 days.

## 2024-05-05 NOTE — Telephone Encounter (Signed)
 Pt states he logged into his Virtual appt and no provider showed up. Appt does show pt showed up. Virtual appt rescheduled for today at 4:45

## 2024-05-05 NOTE — Patient Instructions (Signed)
 Loose or Watery Stool (Diarrhea) in Adults: What It Means Diarrhea is frequent loose and sometimes watery bowel movements. Diarrhea can make you feel weak and cause you to become dehydrated. Dehydration is a condition in which there is not enough water or other fluids in the body. Dehydration can make you tired and thirsty, cause you to have a dry mouth, and decrease how often you urinate. Diarrhea typically lasts 2-3 days. However, it can last longer if it is a sign of something more serious. It is important to treat your diarrhea as told by your health care provider. Follow these instructions at home: Eating and drinking     Follow these recommendations as told by your health care provider: Take an oral rehydration solution (ORS). This is an over-the-counter medicine that helps return your body to its normal balance of nutrients and water. It is found at pharmacies and retail stores. Drink enough fluid to keep your urine pale yellow. Drink fluids such as water, diluted fruit juice, and low-calorie sports drinks. You can drink milk also, if desired. Sucking on ice chips is another way to get fluids. Avoid drinking fluids that contain a lot of sugar or caffeine, such as soda, energy drinks, and regular sports drinks. Avoid alcohol. Eat bland, easy-to-digest foods in small amounts as you are able. These foods include bananas, applesauce, rice, lean meats, toast, and crackers. Avoid spicy or fatty foods.  Medicines Take over-the-counter and prescription medicines only as told by your health care provider. If you were prescribed antibiotics, take them as told by your health care provider. Do not stop using the antibiotic even if you start to feel better. General instructions  Wash your hands often using soap and water for at least 20 seconds. If soap and water are not available, use hand sanitizer. Others in the household should wash their hands as well. Hands should be washed: After using the  toilet or changing a diaper. Before preparing, cooking, or serving food. While caring for a sick person or while visiting someone in a hospital. Rest at home while you recover. Take a warm bath to relieve any burning or pain from frequent diarrhea episodes. Watch your condition for any changes. Contact a health care provider if: You have a fever. Your diarrhea gets worse. You have new symptoms. You vomit every time you eat or drink. You feel light-headed, dizzy, or have a headache. You have muscle cramps. You have signs of dehydration, such as: Dark urine, very little urine, or no urine. Cracked lips. Dry mouth. Sunken eyes. Sleepiness. Weakness. You have bloody or black stools or stools that look like tar. You have severe pain, cramping, or bloating in your abdomen. Your skin feels cold and clammy. You feel confused. Get help right away if: You have chest pain or your heart is beating very quickly. You have trouble breathing or you are breathing very quickly. You feel extremely weak or you faint. These symptoms may be an emergency. Get help right away. Call 911. Do not wait to see if the symptoms will go away. Do not drive yourself to the hospital. This information is not intended to replace advice given to you by your health care provider. Make sure you discuss any questions you have with your health care provider. Document Revised: 01/30/2024 Document Reviewed: 09/09/2021 Elsevier Patient Education  2025 [employer].
